# Patient Record
Sex: Female | Born: 1940 | Race: White | Hispanic: No
Health system: Midwestern US, Academic
[De-identification: ages and names within clinical notes are randomized; demographics above are authoritative.]

---

## 2017-02-25 ENCOUNTER — Encounter: Admit: 2017-02-25 | Discharge: 2017-02-25 | Payer: MEDICARE

## 2017-02-25 NOTE — Telephone Encounter
Patient left a voicemail on the nurse line requesting a call back due to questions she has. Attempted reaching patient but no answer. Left a message for her to call back at her convenience.

## 2017-02-26 ENCOUNTER — Encounter: Admit: 2017-02-26 | Discharge: 2017-02-26 | Payer: MEDICARE

## 2017-02-26 NOTE — Telephone Encounter
Spoke with patient. She has been experiencing intermittent chest tightness over the last 3-4 months. The pain occurs at rest and with exertion. It does not occur everyday. It is not worse with inspiration. The pain is mid-sternal and she rates it at its worst, "5/10". It lasts minutes and is relieved with an extra Aspirin 81 mg. She c/o worsening fatigue but denies nausea/SOB/radiation when the chest tightness occurs. She has a bottle of NTG on-hand but has not needed to take any because the pain "isn't that bad". Reviewed her January 2018 NUC and Echo results with patient, per her request.     She saw her GI physician recently and was told her GI work-up was okay. She does have a history of stomach issues.     Scheduled patient to see Norman Clay in clinic this Monday, 11/5 at 4:00 PM. Confirmed date/time/loc of appt with patient. She verbalizes an understanding of how to use NTG and will not hesitate to use it if needed. She will call 911 if pain is not relieved after NTG x 3. Advised patient if she has any further questions or concerns prior to the 11/5 appt, she should not hesitate to call us.

## 2017-02-26 NOTE — Telephone Encounter
Patient returned the calling, leaving a voicemail on the nurse line. Attempted reaching patient but no answer. Left a message for her to call at her convenience.

## 2017-02-28 LAB — CBC: Lab: 13

## 2017-03-03 ENCOUNTER — Ambulatory Visit: Admit: 2017-03-03 | Discharge: 2017-03-04 | Payer: MEDICARE

## 2017-03-03 NOTE — Progress Notes
Date of Service: 03/03/2017    Alison Fuller is a 76 y.o. female.       HPI       I had the pleasure of seeing Alison Fuller for worsening cardiac symptoms.  She is a 76 year old with history of coronary disease and mitral regurgitation status post mitral valve repair in 2013.  She has had PCI to the LAD and left main.  She had a stress test in January that showed no evidence of ischemia and normal LV systolic function.    Over the last 3 or 4 months she has been having chest tightness that occurs randomly and is not necessarily associated with exertion.  She has not taken any nitro.  She states since she made the appointment however she has not had any recurrent discomfort and she considered cancelling.  She had a GI workup recently which was normal.  She also complains of muscle aches and wonders if the statin may be contributing to that.  She denies any shortness of breath, lower extremity edema, palpitations, near-syncope or syncope.           Vitals:    03/03/17 1545   BP: 150/74   Pulse: 68   Weight: 58.2 kg (128 lb 3.2 oz)   Height: 1.549 m (5' 1)     Body mass index is 24.22 kg/m???.     Past Medical History  Patient Active Problem List    Diagnosis Date Noted   ??? Atypical chest pain 02/21/2016   ??? Leg swelling 09/24/2011   ??? DOE (dyspnea on exertion) 07/03/2011   ??? Chest pain 07/03/2011   ??? Mitral valve regurgitation 07/03/2011     07/12/2011 - Robotic mitral valve repair with right thoracoscopy, triangular resection of P2 and implantation of 27-mm posterior annuloplasty band, [employer] serial number [id number], model number [id number].       ??? Lightheadedness      A. 02/28/09 EP consultation for PVC's with reported lightheadedness.  B. 03/06/09 Positive head up tilt table.Tilt-table test is considered positive for syncope with nitroglycerin challenge,       but not in the unchallenged 30-minute portion.     ??? CKD (chronic kidney disease) 04/25/2008   ??? CAD (coronary artery disease) 07/07/2007 a.   03/09 referred by Dr. Richarda Blade for angina.  Exe echo ositive with 3 mm ST depression and ischemia in the anterior and lateral wall.  b.   03/09 angiogram- Left main 70% with ostial LAD 80%.  LCX diminutive vessel.  RCA large dominant prox 20% EF 70%.  Aorta normal.   PCI of LAD with 3.0 Xience stent and PCI of left main with 3.5 Xience stent and IVUS.  d.   06/09 angiogram for followup left main stenting.  Left main and LAD: No disease or restenosis.  LCX and RCA no disease.  EF 55%  e.   12/09 - Cath: widely patent stent in LM-LAD. No other significant stenosis. Normal LV function.  f.    07/10  Ex Echo normal echo.  Negative for ischemia, fair ex tolerance without chest pain  g.  02/05/10 - Stress MPI - 70%EF appears to be a normal study     ??? Hyperlipidemia 07/07/2007     a.   03/09 total cholesterol 222, triglycerides 66, HDL 74, LDL 135. Zocor started.  b.   05/25/08 TC 159 TG 43 HDL 68 LDL 82 - zocor increased to 80mg  daily.   c.   11/23/08  T chol 160, TG 74, HDL 54, LDL 83  C/o gi intolerance ot zocor.  Changed to crestor 20mg  daily and CoQ 10 100mg      ??? HTN (hypertension)          Review of Systems   Constitution: Positive for malaise/fatigue.   HENT: Positive for nosebleeds.    Eyes: Negative.    Cardiovascular: Positive for dyspnea on exertion.   Respiratory: Positive for shortness of breath.    Hematologic/Lymphatic: Negative.    Skin: Negative.    Musculoskeletal: Positive for arthritis, back pain, neck pain and stiffness.   Gastrointestinal: Negative.    Genitourinary: Negative.    Neurological: Positive for vertigo.   Psychiatric/Behavioral: Negative.    Allergic/Immunologic: Negative.        Physical Exam  General Appearance: no acute distress  Skin: warm & intact  HEENT: unremarkable  Neck Veins: neck veins are flat & not distended  Carotid Arteries: no bruits  Chest Inspection: chest is normal in appearance  Auscultation/Percussion: lungs clear to auscultation, no rales, rhonchi, or wheezing  Cardiac Rhythm: regular rhythm & normal rate  Cardiac Auscultation: Normal S1 & S2, no S3 or S4, no rub  Murmurs: no cardiac murmurs   Extremities: no lower extremity edema; 2+ symmetric distal pulses  Abdominal Exam: soft, non-tender, no masses, bowel sounds normal  Liver & Spleen: no organomegaly  Neurologic Exam: oriented to time, place and person; no focal neurologic deficits  Psychiatric: Normal mood and affect.  Behavior is normal. Judgment and thought content normal.         Cardiovascular Studies  Preliminary EKG:    NSR, rate 68 bpm.  Prior anteroseptal infarct      Problems Addressed Today  Encounter Diagnoses   Name Primary?   ??? Coronary artery disease involving native coronary artery of native heart without angina pectoris Yes   ??? Hypertension, unspecified type    ??? Hyperlipidemia, unspecified hyperlipidemia type        Assessment and Plan       1.  Coronary artery disease post PCI in 2009.  She has had atypical chest discomfort which has resolved at this point.  She had a stress test in January and it was stable with no ischemia.  She needs to continue medical management and aggressive risk factor modification.  2.  Dyslipidemia.  She is having some muscle aches and is concerned it may be related to the pravastatin.  She can hold it for 2 weeks and let us know if her symptoms resolve.  3.  Hypertension.  Her blood pressure is a little high today at 150/74 but she states is within normal limits at home.  We will continue to monitor.    Thank you for allowing [place] to participate in the care of this pleasant individual.  If you have any other questions or concerns, please do not hesitate to contact us.           Current Medications (including today's revisions)  ??? aspirin EC 81 mg PO tablet Take 1 Tab by mouth Daily.   ??? CALCIUM PO Take  by mouth. Calcium , magnesium and zinc 1 tablet daily   ??? Cholecalciferol (Vitamin D3) (VITAMIN D-3) 2,000 unit cap Take 1 Cap by mouth daily. ??? FISH OIL 1,200-144-216 mg Cap Take 1 Cap by mouth daily.   ??? hydroCHLOROthiazide (HYDRODIURIL) 25 mg tablet TAKE 1 TABLET BY MOUTH DAILY   ??? lisinopril (PRINIVIL, ZESTRIL) 40 mg tablet Take 1 tablet  by mouth daily.   ??? nitroglycerin (NITROSTAT) 0.4 mg tablet Place 1 tablet under tongue as Needed for Chest Pain.   ??? pravastatin (PRAVACHOL) 40 mg tablet Take 1 tablet by mouth daily.   ??? vitamins, multiple tablet Take 1 Tab by mouth daily.

## 2017-03-04 DIAGNOSIS — E7849 Other hyperlipidemia: ICD-10-CM

## 2017-03-04 DIAGNOSIS — M791 Myalgia, unspecified site: ICD-10-CM

## 2017-03-04 DIAGNOSIS — I10 Essential (primary) hypertension: ICD-10-CM

## 2017-03-04 DIAGNOSIS — I2510 Atherosclerotic heart disease of native coronary artery without angina pectoris: Principal | ICD-10-CM

## 2017-03-06 ENCOUNTER — Encounter: Admit: 2017-03-06 | Discharge: 2017-03-06 | Payer: MEDICARE

## 2017-03-06 DIAGNOSIS — I2510 Atherosclerotic heart disease of native coronary artery without angina pectoris: ICD-10-CM

## 2017-03-06 DIAGNOSIS — I10 Essential (primary) hypertension: ICD-10-CM

## 2017-03-06 DIAGNOSIS — N189 Chronic kidney disease, unspecified: Secondary | ICD-10-CM

## 2017-03-06 DIAGNOSIS — I340 Nonrheumatic mitral (valve) insufficiency: ICD-10-CM

## 2017-03-06 DIAGNOSIS — I493 Ventricular premature depolarization: ICD-10-CM

## 2017-03-06 DIAGNOSIS — R55 Syncope and collapse: ICD-10-CM

## 2017-03-06 DIAGNOSIS — E785 Hyperlipidemia, unspecified: ICD-10-CM

## 2017-03-06 DIAGNOSIS — R002 Palpitations: ICD-10-CM

## 2017-04-09 ENCOUNTER — Encounter: Admit: 2017-04-09 | Discharge: 2017-04-09 | Payer: MEDICARE

## 2017-04-09 MED ORDER — HYDROCHLOROTHIAZIDE 25 MG PO TAB
ORAL_TABLET | Freq: Every day | ORAL | 0 refills | 28.00000 days | Status: AC
Start: 2017-04-09 — End: 2017-06-24

## 2017-04-09 MED ORDER — PRAVASTATIN 40 MG PO TAB
40 mg | ORAL_TABLET | Freq: Every day | ORAL | 0 refills | 90.00000 days | Status: AC
Start: 2017-04-09 — End: 2017-06-19

## 2017-05-13 ENCOUNTER — Encounter: Admit: 2017-05-13 | Discharge: 2017-05-13 | Payer: MEDICARE

## 2017-06-19 ENCOUNTER — Encounter: Admit: 2017-06-19 | Discharge: 2017-06-19 | Payer: MEDICARE

## 2017-06-19 MED ORDER — PRAVASTATIN 40 MG PO TAB
40 mg | ORAL_TABLET | Freq: Every day | ORAL | 3 refills | 90.00000 days | Status: AC
Start: 2017-06-19 — End: 2018-03-18

## 2017-06-19 MED ORDER — LISINOPRIL 40 MG PO TAB
40 mg | ORAL_TABLET | Freq: Every day | ORAL | 3 refills | Status: AC
Start: 2017-06-19 — End: 2018-03-18

## 2017-06-23 ENCOUNTER — Encounter: Admit: 2017-06-23 | Discharge: 2017-06-23 | Payer: MEDICARE

## 2017-06-24 MED ORDER — HYDROCHLOROTHIAZIDE 25 MG PO TAB
ORAL_TABLET | Freq: Every day | ORAL | 2 refills | 28.00000 days | Status: AC
Start: 2017-06-24 — End: 2018-03-16

## 2017-06-27 ENCOUNTER — Encounter: Admit: 2017-06-27 | Discharge: 2017-06-27 | Payer: MEDICARE

## 2017-08-27 ENCOUNTER — Encounter: Admit: 2017-08-27 | Discharge: 2017-08-27 | Payer: MEDICARE

## 2017-08-27 DIAGNOSIS — I340 Nonrheumatic mitral (valve) insufficiency: Principal | ICD-10-CM

## 2017-09-23 ENCOUNTER — Encounter: Admit: 2017-09-23 | Discharge: 2017-09-23 | Payer: MEDICARE

## 2017-09-23 DIAGNOSIS — E785 Hyperlipidemia, unspecified: Principal | ICD-10-CM

## 2017-09-23 DIAGNOSIS — Z79899 Other long term (current) drug therapy: ICD-10-CM

## 2017-09-23 DIAGNOSIS — I10 Essential (primary) hypertension: ICD-10-CM

## 2017-12-16 ENCOUNTER — Ambulatory Visit: Admit: 2017-12-16 | Discharge: 2017-12-16 | Payer: MEDICARE

## 2017-12-16 ENCOUNTER — Encounter: Admit: 2017-12-16 | Discharge: 2017-12-16 | Payer: MEDICARE

## 2017-12-16 DIAGNOSIS — I2510 Atherosclerotic heart disease of native coronary artery without angina pectoris: ICD-10-CM

## 2017-12-16 DIAGNOSIS — I10 Essential (primary) hypertension: ICD-10-CM

## 2017-12-16 DIAGNOSIS — I340 Nonrheumatic mitral (valve) insufficiency: Principal | ICD-10-CM

## 2017-12-16 DIAGNOSIS — N189 Chronic kidney disease, unspecified: Secondary | ICD-10-CM

## 2017-12-16 DIAGNOSIS — R55 Syncope and collapse: ICD-10-CM

## 2017-12-16 DIAGNOSIS — R002 Palpitations: ICD-10-CM

## 2017-12-16 DIAGNOSIS — I493 Ventricular premature depolarization: ICD-10-CM

## 2017-12-16 DIAGNOSIS — E785 Hyperlipidemia, unspecified: ICD-10-CM

## 2017-12-19 ENCOUNTER — Encounter: Admit: 2017-12-19 | Discharge: 2017-12-19 | Payer: MEDICARE

## 2017-12-22 ENCOUNTER — Encounter: Admit: 2017-12-22 | Discharge: 2017-12-22 | Payer: MEDICARE

## 2017-12-22 ENCOUNTER — Ambulatory Visit: Admit: 2017-12-22 | Discharge: 2017-12-22 | Payer: MEDICARE

## 2017-12-22 DIAGNOSIS — I10 Essential (primary) hypertension: ICD-10-CM

## 2017-12-22 DIAGNOSIS — I340 Nonrheumatic mitral (valve) insufficiency: ICD-10-CM

## 2017-12-22 DIAGNOSIS — I2510 Atherosclerotic heart disease of native coronary artery without angina pectoris: ICD-10-CM

## 2017-12-22 DIAGNOSIS — I493 Ventricular premature depolarization: ICD-10-CM

## 2017-12-22 DIAGNOSIS — Z01818 Encounter for other preprocedural examination: ICD-10-CM

## 2017-12-22 DIAGNOSIS — E785 Hyperlipidemia, unspecified: ICD-10-CM

## 2017-12-22 DIAGNOSIS — R002 Palpitations: ICD-10-CM

## 2017-12-22 DIAGNOSIS — R55 Syncope and collapse: ICD-10-CM

## 2017-12-22 DIAGNOSIS — N189 Chronic kidney disease, unspecified: ICD-10-CM

## 2017-12-22 MED ORDER — FENTANYL CITRATE (PF) 50 MCG/ML IJ SOLN
25 ug | INTRAVENOUS | 0 refills | Status: CN | PRN
Start: 2017-12-22 — End: ?

## 2017-12-22 MED ORDER — PROPOFOL 10 MG/ML IV EMUL 20 ML (INFUSION)(AM)(OR)
INTRAVENOUS | 0 refills | Status: DC
Start: 2017-12-22 — End: 2017-12-22
  Administered 2017-12-22: 15:00:00 50 ug/kg/min via INTRAVENOUS

## 2017-12-22 MED ORDER — PHENYLEPHRINE IN 0.9% NACL(PF) 1 MG/10 ML (100 MCG/ML) IV SYRG
0 refills | Status: DC
Start: 2017-12-22 — End: 2017-12-22
  Administered 2017-12-22 (×2): 100 ug via INTRAVENOUS

## 2017-12-22 MED ORDER — ONDANSETRON HCL (PF) 4 MG/2 ML IJ SOLN
4 mg | Freq: Once | INTRAVENOUS | 0 refills | Status: CN | PRN
Start: 2017-12-22 — End: ?

## 2017-12-22 MED ORDER — LIDOCAINE (PF) 200 MG/10 ML (2 %) IJ SYRG
0 refills | Status: DC
Start: 2017-12-22 — End: 2017-12-22
  Administered 2017-12-22: 15:00:00 20 mg via INTRAVENOUS

## 2017-12-22 MED ORDER — SODIUM CHLORIDE 0.9 % IV SOLP (OR) 500ML
0 refills | Status: DC
Start: 2017-12-22 — End: 2017-12-22
  Administered 2017-12-22: 15:00:00 via INTRAVENOUS

## 2017-12-23 ENCOUNTER — Encounter: Admit: 2017-12-23 | Discharge: 2017-12-23 | Payer: MEDICARE

## 2017-12-23 DIAGNOSIS — I340 Nonrheumatic mitral (valve) insufficiency: Principal | ICD-10-CM

## 2017-12-24 ENCOUNTER — Encounter: Admit: 2017-12-24 | Discharge: 2017-12-24 | Payer: MEDICARE

## 2017-12-30 ENCOUNTER — Encounter: Admit: 2017-12-30 | Discharge: 2017-12-30 | Payer: MEDICARE

## 2017-12-31 ENCOUNTER — Encounter: Admit: 2017-12-31 | Discharge: 2017-12-31 | Payer: MEDICARE

## 2017-12-31 DIAGNOSIS — I340 Nonrheumatic mitral (valve) insufficiency: Principal | ICD-10-CM

## 2017-12-31 MED ORDER — LIDOCAINE (PF) 10 MG/ML (1 %) IJ SOLN
.1-2 mL | INTRAMUSCULAR | 0 refills | Status: CN | PRN
Start: 2017-12-31 — End: ?

## 2017-12-31 MED ORDER — ASPIRIN 81 MG PO TBEC
81 mg | Freq: Once | ORAL | 0 refills | Status: CN
Start: 2017-12-31 — End: ?

## 2017-12-31 MED ORDER — SODIUM CHLORIDE 0.9 % IV SOLP
INTRAVENOUS | 0 refills | Status: CN
Start: 2017-12-31 — End: ?

## 2018-01-01 ENCOUNTER — Encounter: Admit: 2018-01-01 | Discharge: 2018-01-01 | Payer: MEDICARE

## 2018-01-05 ENCOUNTER — Ambulatory Visit: Admit: 2018-01-05 | Discharge: 2018-01-05 | Payer: MEDICARE

## 2018-01-05 ENCOUNTER — Encounter: Admit: 2018-01-05 | Discharge: 2018-01-05 | Payer: MEDICARE

## 2018-01-05 ENCOUNTER — Ambulatory Visit: Admit: 2018-01-05 | Discharge: 2018-01-06 | Payer: MEDICARE

## 2018-01-05 DIAGNOSIS — R55 Syncope and collapse: ICD-10-CM

## 2018-01-05 DIAGNOSIS — R002 Palpitations: ICD-10-CM

## 2018-01-05 DIAGNOSIS — I493 Ventricular premature depolarization: ICD-10-CM

## 2018-01-05 DIAGNOSIS — J302 Other seasonal allergic rhinitis: ICD-10-CM

## 2018-01-05 DIAGNOSIS — I340 Nonrheumatic mitral (valve) insufficiency: Principal | ICD-10-CM

## 2018-01-05 DIAGNOSIS — E785 Hyperlipidemia, unspecified: ICD-10-CM

## 2018-01-05 DIAGNOSIS — Z8719 Personal history of other diseases of the digestive system: ICD-10-CM

## 2018-01-05 DIAGNOSIS — M1990 Unspecified osteoarthritis, unspecified site: ICD-10-CM

## 2018-01-05 DIAGNOSIS — J40 Bronchitis, not specified as acute or chronic: ICD-10-CM

## 2018-01-05 DIAGNOSIS — I10 Essential (primary) hypertension: ICD-10-CM

## 2018-01-05 DIAGNOSIS — I2510 Atherosclerotic heart disease of native coronary artery without angina pectoris: Principal | ICD-10-CM

## 2018-01-05 DIAGNOSIS — N189 Chronic kidney disease, unspecified: Secondary | ICD-10-CM

## 2018-01-05 LAB — COMPREHENSIVE METABOLIC PANEL
Lab: 0.6 mg/dL (ref 0.3–1.2)
Lab: 10 mg/dL (ref 8.5–10.6)
Lab: 101 MMOL/L (ref 98–110)
Lab: 13 U/L (ref 7–56)
Lab: 138 MMOL/L (ref 137–147)
Lab: 19 U/L (ref 7–40)
Lab: 27 mg/dL — ABNORMAL HIGH (ref 7–25)
Lab: 38 mL/min — ABNORMAL LOW (ref >60)
Lab: 4.2 MMOL/L (ref 3.5–5.1)
Lab: 4.5 g/dL (ref 3.5–5.0)
Lab: 46 mL/min — ABNORMAL LOW (ref >60)
Lab: 69 mg/dL — ABNORMAL LOW (ref 70–100)
Lab: 7 (ref 3–12)
Lab: 7.7 g/dL (ref 6.0–8.0)

## 2018-01-05 LAB — PTT (APTT): Lab: 29 s (ref 24.0–36.5)

## 2018-01-05 LAB — URINALYSIS DIPSTICK REFLEX TO CULTURE: Lab: NEGATIVE

## 2018-01-05 LAB — PROTIME INR (PT): Lab: 0.9 U/L (ref 0.8–1.2)

## 2018-01-05 LAB — CBC
Lab: 4.2 M/UL (ref 4.0–5.0)
Lab: 7.5 10*3/uL (ref 4.5–11.0)

## 2018-01-05 LAB — URINALYSIS MICROSCOPIC REFLEX TO CULTURE

## 2018-01-05 LAB — BNP (B-TYPE NATRIURETIC PEPTI): Lab: 53 pg/mL (ref 0–100)

## 2018-01-06 ENCOUNTER — Inpatient Hospital Stay: Admit: 2018-01-06 | Discharge: 2018-01-06 | Payer: MEDICARE

## 2018-01-06 ENCOUNTER — Encounter: Admit: 2018-01-06 | Discharge: 2018-01-06 | Payer: MEDICARE

## 2018-01-06 ENCOUNTER — Inpatient Hospital Stay: Admit: 2018-01-06 | Discharge: 2018-01-07 | Disposition: A | Discharge status: Home / Self Care | Payer: MEDICARE

## 2018-01-06 DIAGNOSIS — R55 Syncope and collapse: ICD-10-CM

## 2018-01-06 DIAGNOSIS — I2510 Atherosclerotic heart disease of native coronary artery without angina pectoris: Principal | ICD-10-CM

## 2018-01-06 DIAGNOSIS — I340 Nonrheumatic mitral (valve) insufficiency: ICD-10-CM

## 2018-01-06 DIAGNOSIS — I493 Ventricular premature depolarization: ICD-10-CM

## 2018-01-06 DIAGNOSIS — N189 Chronic kidney disease, unspecified: Secondary | ICD-10-CM

## 2018-01-06 DIAGNOSIS — M1990 Unspecified osteoarthritis, unspecified site: ICD-10-CM

## 2018-01-06 DIAGNOSIS — R002 Palpitations: ICD-10-CM

## 2018-01-06 DIAGNOSIS — E785 Hyperlipidemia, unspecified: ICD-10-CM

## 2018-01-06 DIAGNOSIS — I10 Essential (primary) hypertension: ICD-10-CM

## 2018-01-06 DIAGNOSIS — J40 Bronchitis, not specified as acute or chronic: ICD-10-CM

## 2018-01-06 DIAGNOSIS — Z8719 Personal history of other diseases of the digestive system: ICD-10-CM

## 2018-01-06 DIAGNOSIS — J302 Other seasonal allergic rhinitis: ICD-10-CM

## 2018-01-06 LAB — BASIC METABOLIC PANEL
Lab: 1.1 mg/dL — ABNORMAL HIGH (ref 0.4–1.00)
Lab: 106 mg/dL — ABNORMAL HIGH (ref 70–100)
Lab: 139 MMOL/L — ABNORMAL LOW (ref 137–147)
Lab: 22 mg/dL (ref 7–25)
Lab: 26 MMOL/L (ref 21–30)
Lab: 44 mL/min — ABNORMAL LOW (ref >60)
Lab: 54 mL/min — ABNORMAL LOW (ref >60)
Lab: 7 pg (ref 3–12)
Lab: 8.5 mg/dL (ref 8.5–10.6)

## 2018-01-06 LAB — PROTIME INR (PT): Lab: 0.9 MMOL/L (ref 0.8–1.2)

## 2018-01-06 LAB — PTT (APTT): Lab: 41 s — ABNORMAL HIGH (ref 24.0–36.5)

## 2018-01-06 LAB — CBC: Lab: 9.4 10*3/uL (ref 4.5–11.0)

## 2018-01-06 MED ORDER — ACETAMINOPHEN 325 MG PO TAB
650 mg | ORAL_TABLET | ORAL | 0 refills | Status: CN | PRN
Start: 2018-01-06 — End: ?

## 2018-01-06 MED ORDER — PROPOFOL INJ 10 MG/ML IV VIAL
0 refills | Status: DC
Start: 2018-01-06 — End: 2018-01-06
  Administered 2018-01-06: 14:00:00 50 mg via INTRAVENOUS
  Administered 2018-01-06: 15:00:00 20 mg via INTRAVENOUS
  Administered 2018-01-06: 13:00:00 100 mg via INTRAVENOUS
  Administered 2018-01-06: 14:00:00 30 mg via INTRAVENOUS

## 2018-01-06 MED ORDER — CEFAZOLIN INJ 1GM IVP
1 g | Freq: Two times a day (BID) | INTRAVENOUS | 0 refills | Status: CP
Start: 2018-01-06 — End: ?
  Administered 2018-01-07 (×2): 1 g via INTRAVENOUS

## 2018-01-06 MED ORDER — OXYCODONE 5 MG PO TAB
5-10 mg | ORAL | 0 refills | Status: DC | PRN
Start: 2018-01-06 — End: 2018-01-07

## 2018-01-06 MED ORDER — PHENYLEPHRINE IV DRIP (STD CONC)
0 refills | Status: DC
Start: 2018-01-06 — End: 2018-01-06
  Administered 2018-01-06 (×2): 0.2 ug/kg/min via INTRAVENOUS

## 2018-01-06 MED ORDER — BISACODYL 10 MG RE SUPP
10 mg | Freq: Every day | RECTAL | 0 refills | Status: DC | PRN
Start: 2018-01-06 — End: 2018-01-07

## 2018-01-06 MED ORDER — LIDOCAINE (PF) 10 MG/ML (1 %) IJ SOLN
.1-2 mL | INTRAMUSCULAR | 0 refills | Status: DC | PRN
Start: 2018-01-06 — End: 2018-01-06

## 2018-01-06 MED ORDER — HYDRALAZINE 20 MG/ML IJ SOLN
10 mg | INTRAVENOUS | 0 refills | Status: DC | PRN
Start: 2018-01-06 — End: 2018-01-07

## 2018-01-06 MED ORDER — METOCLOPRAMIDE HCL 5 MG/ML IJ SOLN
10 mg | Freq: Once | INTRAVENOUS | 0 refills | Status: DC | PRN
Start: 2018-01-06 — End: 2018-01-06

## 2018-01-06 MED ORDER — MULTIVITAMIN PO TAB
1 | Freq: Every day | ORAL | 0 refills | Status: DC
Start: 2018-01-06 — End: 2018-01-07
  Administered 2018-01-06 – 2018-01-07 (×2): 1 via ORAL

## 2018-01-06 MED ORDER — PROCHLORPERAZINE 25 MG RE SUPP
25 mg | RECTAL | 0 refills | Status: DC | PRN
Start: 2018-01-06 — End: 2018-01-07

## 2018-01-06 MED ORDER — LIDOCAINE (PF) 200 MG/10 ML (2 %) IJ SYRG
0 refills | Status: DC
Start: 2018-01-06 — End: 2018-01-06
  Administered 2018-01-06: 13:00:00 80 mg via INTRAVENOUS

## 2018-01-06 MED ORDER — VECURONIUM BROMIDE 10 MG IV SOLR
0 refills | Status: DC
Start: 2018-01-06 — End: 2018-01-06
  Administered 2018-01-06: 13:00:00 4 mg via INTRAVENOUS

## 2018-01-06 MED ORDER — NEOSTIGMINE METHYLSULFATE 1 MG/ML IJ SOLN
0 refills | Status: DC
Start: 2018-01-06 — End: 2018-01-06
  Administered 2018-01-06: 15:00:00 3 mg via INTRAVENOUS

## 2018-01-06 MED ORDER — ACETAMINOPHEN 325 MG PO TAB
650 mg | ORAL | 0 refills | Status: DC | PRN
Start: 2018-01-06 — End: 2018-01-07

## 2018-01-06 MED ORDER — OMEGA 3-DHA-EPA-FISH OIL 300-1,000 MG PO CPDR
1000 mg | Freq: Every day | ORAL | 0 refills | Status: DC
Start: 2018-01-06 — End: 2018-01-07
  Administered 2018-01-06 – 2018-01-07 (×2): 1000 mg via ORAL

## 2018-01-06 MED ORDER — ASPIRIN 81 MG PO TBEC
81 mg | Freq: Once | ORAL | 0 refills | Status: CP
Start: 2018-01-06 — End: ?
  Administered 2018-01-06: 13:00:00 81 mg via ORAL

## 2018-01-06 MED ORDER — SODIUM CHLORIDE 0.9 % IV SOLP
INTRAVENOUS | 0 refills | Status: DC
Start: 2018-01-06 — End: 2018-01-07

## 2018-01-06 MED ORDER — GLYCOPYRROLATE 0.2 MG/ML IJ SOLN
0 refills | Status: DC
Start: 2018-01-06 — End: 2018-01-06
  Administered 2018-01-06: 15:00:00 .4 mg via INTRAVENOUS

## 2018-01-06 MED ORDER — PANTOPRAZOLE 40 MG PO TBEC
40 mg | Freq: Every day | ORAL | 0 refills | Status: DC | PRN
Start: 2018-01-06 — End: 2018-01-07

## 2018-01-06 MED ORDER — ONDANSETRON HCL (PF) 4 MG/2 ML IJ SOLN
4 mg | INTRAVENOUS | 0 refills | Status: DC | PRN
Start: 2018-01-06 — End: 2018-01-07

## 2018-01-06 MED ORDER — FENTANYL CITRATE (PF) 50 MCG/ML IJ SOLN
25 ug | INTRAVENOUS | 0 refills | Status: DC | PRN
Start: 2018-01-06 — End: 2018-01-06

## 2018-01-06 MED ORDER — POTASSIUM CHLORIDE 20 MEQ PO TBTQ
20-40 meq | ORAL | 0 refills | Status: DC | PRN
Start: 2018-01-06 — End: 2018-01-07
  Administered 2018-01-07: 11:00:00 20 meq via ORAL

## 2018-01-06 MED ORDER — SODIUM CHLORIDE 0.9 % IV SOLP
INTRAVENOUS | 0 refills | Status: DC
Start: 2018-01-06 — End: 2018-01-06
  Administered 2018-01-06: 12:00:00 1000.000 mL via INTRAVENOUS

## 2018-01-06 MED ORDER — FUROSEMIDE 10 MG/ML IJ SOLN
40 mg | Freq: Once | INTRAVENOUS | 0 refills | Status: CP
Start: 2018-01-06 — End: ?
  Administered 2018-01-06: 21:00:00 40 mg via INTRAVENOUS

## 2018-01-06 MED ORDER — CLOPIDOGREL 75 MG PO TAB
75 mg | ORAL_TABLET | Freq: Every day | ORAL | 3 refills | Status: CN
Start: 2018-01-06 — End: ?

## 2018-01-06 MED ORDER — POTASSIUM CHLORIDE 20 MEQ/15 ML PO LIQD
20-40 meq | NASOGASTRIC | 0 refills | Status: DC | PRN
Start: 2018-01-06 — End: 2018-01-07

## 2018-01-06 MED ORDER — HALOPERIDOL LACTATE 5 MG/ML IJ SOLN
0 refills | Status: DC
Start: 2018-01-06 — End: 2018-01-06
  Administered 2018-01-06: 15:00:00 1 mg via INTRAVENOUS

## 2018-01-06 MED ORDER — PROTAMINE 10 MG/ML IV SOLN
0 refills | Status: DC
Start: 2018-01-06 — End: 2018-01-06
  Administered 2018-01-06: 15:00:00 20 mg via INTRAVENOUS
  Administered 2018-01-06: 15:00:00 10 mg via INTRAVENOUS
  Administered 2018-01-06: 15:00:00 20 mg via INTRAVENOUS

## 2018-01-06 MED ORDER — SENNOSIDES-DOCUSATE SODIUM 8.6-50 MG PO TAB
2 | Freq: Two times a day (BID) | ORAL | 0 refills | Status: DC
Start: 2018-01-06 — End: 2018-01-07
  Administered 2018-01-07: 01:00:00 2 via ORAL

## 2018-01-06 MED ORDER — POTASSIUM CHLORIDE 20 MEQ PO TBTQ
20 meq | Freq: Once | ORAL | 0 refills | Status: CP
Start: 2018-01-06 — End: ?
  Administered 2018-01-06: 20:00:00 20 meq via ORAL

## 2018-01-06 MED ORDER — CEFAZOLIN 1 GRAM IJ SOLR
0 refills | Status: DC
Start: 2018-01-06 — End: 2018-01-06
  Administered 2018-01-06: 14:00:00 2 g via INTRAVENOUS

## 2018-01-06 MED ORDER — BENZOCAINE-MENTHOL 6-10 MG MM LOZG
1 | ORAL | 0 refills | Status: DC | PRN
Start: 2018-01-06 — End: 2018-01-07
  Administered 2018-01-06: 19:00:00 1 via ORAL

## 2018-01-06 MED ORDER — CLOPIDOGREL 75 MG PO TAB
75 mg | Freq: Every day | ORAL | 0 refills | Status: DC
Start: 2018-01-06 — End: 2018-01-07
  Administered 2018-01-06 – 2018-01-07 (×2): 75 mg via ORAL

## 2018-01-06 MED ORDER — MAGNESIUM HYDROXIDE 2,400 MG/10 ML PO SUSP
10 mL | Freq: Every day | ORAL | 0 refills | Status: DC | PRN
Start: 2018-01-06 — End: 2018-01-07

## 2018-01-06 MED ORDER — INSULIN ASPART 100 UNIT/ML SC FLEXPEN
0-12 [IU] | Freq: Before meals | SUBCUTANEOUS | 0 refills | Status: DC
Start: 2018-01-06 — End: 2018-01-07

## 2018-01-06 MED ORDER — HYDROCHLOROTHIAZIDE 25 MG PO TAB
25 mg | Freq: Every day | ORAL | 0 refills | Status: DC
Start: 2018-01-06 — End: 2018-01-07
  Administered 2018-01-07: 13:00:00 25 mg via ORAL

## 2018-01-06 MED ORDER — PROMETHAZINE 25 MG/ML IJ SOLN
6.25 mg | INTRAVENOUS | 0 refills | Status: DC | PRN
Start: 2018-01-06 — End: 2018-01-06

## 2018-01-06 MED ORDER — LORATADINE 10 MG PO TAB
10 mg | Freq: Every morning | ORAL | 0 refills | Status: DC
Start: 2018-01-06 — End: 2018-01-07
  Administered 2018-01-07: 13:00:00 10 mg via ORAL

## 2018-01-06 MED ORDER — HEPARIN (PORCINE) 1,000 UNIT/ML IJ SOLN
0 refills | Status: DC
Start: 2018-01-06 — End: 2018-01-06
  Administered 2018-01-06: 14:00:00 2000 [IU] via INTRAVENOUS
  Administered 2018-01-06: 15:00:00 1000 [IU] via INTRAVENOUS
  Administered 2018-01-06: 14:00:00 4000 [IU] via INTRAVENOUS
  Administered 2018-01-06: 15:00:00 2000 [IU] via INTRAVENOUS

## 2018-01-07 ENCOUNTER — Encounter: Admit: 2018-01-07 | Discharge: 2018-01-07 | Payer: MEDICARE

## 2018-01-07 ENCOUNTER — Inpatient Hospital Stay: Admit: 2018-01-05 | Discharge: 2018-01-05 | Payer: MEDICARE

## 2018-01-07 ENCOUNTER — Inpatient Hospital Stay: Admit: 2018-01-07 | Discharge: 2018-01-07 | Payer: MEDICARE

## 2018-01-07 DIAGNOSIS — Z9861 Coronary angioplasty status: ICD-10-CM

## 2018-01-07 DIAGNOSIS — I5032 Chronic diastolic (congestive) heart failure: ICD-10-CM

## 2018-01-07 DIAGNOSIS — Z8711 Personal history of peptic ulcer disease: ICD-10-CM

## 2018-01-07 DIAGNOSIS — E785 Hyperlipidemia, unspecified: ICD-10-CM

## 2018-01-07 DIAGNOSIS — Z006 Encounter for examination for normal comparison and control in clinical research program: ICD-10-CM

## 2018-01-07 DIAGNOSIS — I2510 Atherosclerotic heart disease of native coronary artery without angina pectoris: ICD-10-CM

## 2018-01-07 DIAGNOSIS — R0609 Other forms of dyspnea: ICD-10-CM

## 2018-01-07 DIAGNOSIS — N189 Chronic kidney disease, unspecified: ICD-10-CM

## 2018-01-07 DIAGNOSIS — I340 Nonrheumatic mitral (valve) insufficiency: Principal | ICD-10-CM

## 2018-01-07 DIAGNOSIS — I130 Hypertensive heart and chronic kidney disease with heart failure and stage 1 through stage 4 chronic kidney disease, or unspecified chronic kidney disease: ICD-10-CM

## 2018-01-07 LAB — POC ACTIVATED CLOTTING TIME
Lab: 268 s
Lab: 295 s
Lab: 373 s

## 2018-01-07 LAB — POC GLUCOSE: Lab: 109 mg/dL — ABNORMAL HIGH (ref 70–100)

## 2018-01-07 LAB — HEMOGLOBIN A1C: Lab: 5.2 % — ABNORMAL HIGH (ref 4.0–6.0)

## 2018-01-07 LAB — CBC: Lab: 9 K/UL — ABNORMAL HIGH (ref >60)

## 2018-01-07 LAB — BASIC METABOLIC PANEL: Lab: 142 MMOL/L — ABNORMAL HIGH (ref >60)

## 2018-01-07 MED ORDER — PERFLUTREN LIPID MICROSPHERES 1.1 MG/ML IV SUSP
1-20 mL | Freq: Once | INTRAVENOUS | 0 refills | Status: CP | PRN
Start: 2018-01-07 — End: ?
  Administered 2018-01-07: 15:00:00 2 mL via INTRAVENOUS

## 2018-01-07 MED ORDER — ACETAMINOPHEN 325 MG PO TAB
650 mg | ORAL | 0 refills | Status: AC | PRN
Start: 2018-01-07 — End: ?

## 2018-01-07 MED ORDER — CLOPIDOGREL 75 MG PO TAB
75 mg | ORAL_TABLET | Freq: Every day | ORAL | 0 refills | 90.00000 days | Status: AC
Start: 2018-01-07 — End: 2018-01-27
  Filled 2018-01-07 (×2): qty 30, 30d supply, fill #1

## 2018-01-12 ENCOUNTER — Encounter: Admit: 2018-01-12 | Discharge: 2018-01-12 | Payer: MEDICARE

## 2018-01-27 ENCOUNTER — Encounter: Admit: 2018-01-27 | Discharge: 2018-01-27 | Payer: MEDICARE

## 2018-01-27 MED ORDER — CLOPIDOGREL 75 MG PO TAB
75 mg | ORAL_TABLET | Freq: Every day | ORAL | 0 refills | 90.00000 days | Status: AC
Start: 2018-01-27 — End: 2018-07-27

## 2018-01-28 ENCOUNTER — Encounter: Admit: 2018-01-28 | Discharge: 2018-01-28 | Payer: MEDICARE

## 2018-01-28 MED ORDER — CLOPIDOGREL 75 MG PO TAB
ORAL_TABLET | Freq: Every day | 0 refills
Start: 2018-01-28 — End: ?

## 2018-02-03 ENCOUNTER — Encounter: Admit: 2018-02-03 | Discharge: 2018-02-03 | Payer: MEDICARE

## 2018-02-03 DIAGNOSIS — Z9889 Other specified postprocedural states: ICD-10-CM

## 2018-02-03 DIAGNOSIS — I10 Essential (primary) hypertension: Principal | ICD-10-CM

## 2018-02-03 DIAGNOSIS — I340 Nonrheumatic mitral (valve) insufficiency: ICD-10-CM

## 2018-02-06 ENCOUNTER — Ambulatory Visit: Admit: 2018-02-06 | Discharge: 2018-02-06 | Payer: MEDICARE

## 2018-02-06 ENCOUNTER — Encounter: Admit: 2018-02-06 | Discharge: 2018-02-06 | Payer: MEDICARE

## 2018-02-06 DIAGNOSIS — Z8719 Personal history of other diseases of the digestive system: ICD-10-CM

## 2018-02-06 DIAGNOSIS — I493 Ventricular premature depolarization: ICD-10-CM

## 2018-02-06 DIAGNOSIS — I2510 Atherosclerotic heart disease of native coronary artery without angina pectoris: ICD-10-CM

## 2018-02-06 DIAGNOSIS — I10 Essential (primary) hypertension: ICD-10-CM

## 2018-02-06 DIAGNOSIS — I5032 Chronic diastolic (congestive) heart failure: ICD-10-CM

## 2018-02-06 DIAGNOSIS — N189 Chronic kidney disease, unspecified: Secondary | ICD-10-CM

## 2018-02-06 DIAGNOSIS — I340 Nonrheumatic mitral (valve) insufficiency: ICD-10-CM

## 2018-02-06 DIAGNOSIS — Z95818 Presence of other cardiac implants and grafts: ICD-10-CM

## 2018-02-06 DIAGNOSIS — J302 Other seasonal allergic rhinitis: ICD-10-CM

## 2018-02-06 DIAGNOSIS — E785 Hyperlipidemia, unspecified: ICD-10-CM

## 2018-02-06 DIAGNOSIS — Z9889 Other specified postprocedural states: Secondary | ICD-10-CM

## 2018-02-06 DIAGNOSIS — R002 Palpitations: ICD-10-CM

## 2018-02-06 DIAGNOSIS — M1990 Unspecified osteoarthritis, unspecified site: ICD-10-CM

## 2018-02-06 DIAGNOSIS — J40 Bronchitis, not specified as acute or chronic: ICD-10-CM

## 2018-02-06 DIAGNOSIS — R55 Syncope and collapse: ICD-10-CM

## 2018-02-06 LAB — CBC
Lab: 4.3 M/UL — ABNORMAL HIGH (ref 8–22)
Lab: 41 % — ABNORMAL HIGH (ref 70–100)
Lab: 6.5 10*3/uL (ref 4.5–11.0)

## 2018-02-06 LAB — BNP (B-TYPE NATRIURETIC PEPTI): Lab: 104 pg/mL — ABNORMAL HIGH (ref 0.72–1.25)

## 2018-02-06 LAB — BASIC METABOLIC PANEL
Lab: 1.3 mg/dL — ABNORMAL HIGH (ref 0.4–1.00)
Lab: 103 MMOL/L (ref 98–110)
Lab: 139 MMOL/L (ref 137–147)
Lab: 28 MMOL/L (ref 21–30)
Lab: 3.8 MMOL/L (ref 3.5–5.1)
Lab: 32 mg/dL — ABNORMAL HIGH (ref 7–25)
Lab: 37 mL/min — ABNORMAL LOW (ref >60)
Lab: 45 mL/min — ABNORMAL LOW (ref >60)
Lab: 8 10*3/uL (ref 3–12)
Lab: 87 mg/dL (ref 70–100)
Lab: 9.9 mg/dL (ref 8.5–10.6)

## 2018-02-10 ENCOUNTER — Encounter: Admit: 2018-02-10 | Discharge: 2018-02-10 | Payer: MEDICARE

## 2018-02-10 DIAGNOSIS — Z8719 Personal history of other diseases of the digestive system: ICD-10-CM

## 2018-02-10 DIAGNOSIS — I10 Essential (primary) hypertension: ICD-10-CM

## 2018-02-10 DIAGNOSIS — R002 Palpitations: ICD-10-CM

## 2018-02-10 DIAGNOSIS — E785 Hyperlipidemia, unspecified: ICD-10-CM

## 2018-02-10 DIAGNOSIS — N189 Chronic kidney disease, unspecified: ICD-10-CM

## 2018-02-10 DIAGNOSIS — J40 Bronchitis, not specified as acute or chronic: ICD-10-CM

## 2018-02-10 DIAGNOSIS — M1990 Unspecified osteoarthritis, unspecified site: ICD-10-CM

## 2018-02-10 DIAGNOSIS — J302 Other seasonal allergic rhinitis: ICD-10-CM

## 2018-02-10 DIAGNOSIS — R55 Syncope and collapse: ICD-10-CM

## 2018-02-10 DIAGNOSIS — I340 Nonrheumatic mitral (valve) insufficiency: ICD-10-CM

## 2018-02-10 DIAGNOSIS — I493 Ventricular premature depolarization: ICD-10-CM

## 2018-02-10 DIAGNOSIS — I2510 Atherosclerotic heart disease of native coronary artery without angina pectoris: Principal | ICD-10-CM

## 2018-03-16 ENCOUNTER — Encounter: Admit: 2018-03-16 | Discharge: 2018-03-16 | Payer: MEDICARE

## 2018-03-16 MED ORDER — HYDROCHLOROTHIAZIDE 25 MG PO TAB
ORAL_TABLET | Freq: Every day | ORAL | 1 refills | 28.00000 days | Status: AC
Start: 2018-03-16 — End: 2018-06-22

## 2018-03-17 ENCOUNTER — Encounter: Admit: 2018-03-17 | Discharge: 2018-03-17 | Payer: MEDICARE

## 2018-03-17 DIAGNOSIS — E785 Hyperlipidemia, unspecified: ICD-10-CM

## 2018-03-17 DIAGNOSIS — I10 Essential (primary) hypertension: Principal | ICD-10-CM

## 2018-03-17 DIAGNOSIS — I2510 Atherosclerotic heart disease of native coronary artery without angina pectoris: ICD-10-CM

## 2018-03-18 MED ORDER — LISINOPRIL 40 MG PO TAB
40 mg | ORAL_TABLET | Freq: Every day | ORAL | 0 refills | Status: AC
Start: 2018-03-18 — End: 2018-06-22

## 2018-03-18 MED ORDER — PRAVASTATIN 40 MG PO TAB
40 mg | ORAL_TABLET | Freq: Every evening | ORAL | 0 refills | 90.00000 days | Status: AC
Start: 2018-03-18 — End: 2018-07-21

## 2018-05-26 ENCOUNTER — Encounter: Admit: 2018-05-26 | Discharge: 2018-05-26 | Payer: MEDICARE

## 2018-05-26 DIAGNOSIS — I10 Essential (primary) hypertension: Secondary | ICD-10-CM

## 2018-05-26 DIAGNOSIS — I2510 Atherosclerotic heart disease of native coronary artery without angina pectoris: Secondary | ICD-10-CM

## 2018-05-26 DIAGNOSIS — E785 Hyperlipidemia, unspecified: Secondary | ICD-10-CM

## 2018-05-26 LAB — ALT (SGPT): Lab: 29 U/L (ref 7–56)

## 2018-06-22 ENCOUNTER — Encounter: Admit: 2018-06-22 | Discharge: 2018-06-22 | Payer: MEDICARE

## 2018-06-22 MED ORDER — HYDROCHLOROTHIAZIDE 25 MG PO TAB
25 mg | ORAL_TABLET | Freq: Every day | ORAL | 1 refills | 28.00000 days | Status: AC
Start: 2018-06-22 — End: 2019-02-01

## 2018-06-22 MED ORDER — LISINOPRIL 40 MG PO TAB
40 mg | ORAL_TABLET | Freq: Every day | ORAL | 0 refills | Status: AC
Start: 2018-06-22 — End: 2018-09-29

## 2018-06-22 MED ORDER — PANTOPRAZOLE 40 MG PO TBEC
40 mg | ORAL_TABLET | Freq: Every day | ORAL | 1 refills | 90.00000 days | Status: AC | PRN
Start: 2018-06-22 — End: 2019-02-01

## 2018-06-30 ENCOUNTER — Encounter: Admit: 2018-06-30 | Discharge: 2018-06-30 | Payer: MEDICARE

## 2018-07-21 ENCOUNTER — Encounter: Admit: 2018-07-21 | Discharge: 2018-07-21 | Payer: MEDICARE

## 2018-07-21 MED ORDER — PRAVASTATIN 40 MG PO TAB
40 mg | ORAL_TABLET | Freq: Every evening | ORAL | 0 refills | 90.00000 days | Status: AC
Start: 2018-07-21 — End: 2018-11-12

## 2018-07-27 ENCOUNTER — Encounter: Admit: 2018-07-27 | Discharge: 2018-07-27 | Payer: MEDICARE

## 2018-07-27 DIAGNOSIS — I340 Nonrheumatic mitral (valve) insufficiency: Principal | ICD-10-CM

## 2018-07-27 DIAGNOSIS — M1990 Unspecified osteoarthritis, unspecified site: ICD-10-CM

## 2018-07-27 DIAGNOSIS — J302 Other seasonal allergic rhinitis: ICD-10-CM

## 2018-07-27 DIAGNOSIS — R55 Syncope and collapse: ICD-10-CM

## 2018-07-27 DIAGNOSIS — I493 Ventricular premature depolarization: ICD-10-CM

## 2018-07-27 DIAGNOSIS — E785 Hyperlipidemia, unspecified: ICD-10-CM

## 2018-07-27 DIAGNOSIS — I10 Essential (primary) hypertension: ICD-10-CM

## 2018-07-27 DIAGNOSIS — J40 Bronchitis, not specified as acute or chronic: ICD-10-CM

## 2018-07-27 DIAGNOSIS — N189 Chronic kidney disease, unspecified: ICD-10-CM

## 2018-07-27 DIAGNOSIS — R002 Palpitations: ICD-10-CM

## 2018-07-27 DIAGNOSIS — I2510 Atherosclerotic heart disease of native coronary artery without angina pectoris: Principal | ICD-10-CM

## 2018-07-27 DIAGNOSIS — R0600 Dyspnea, unspecified: ICD-10-CM

## 2018-07-27 DIAGNOSIS — Z8719 Personal history of other diseases of the digestive system: ICD-10-CM

## 2018-07-27 MED ORDER — NITROGLYCERIN 0.4 MG SL SUBL
0.4 mg | ORAL_TABLET | SUBLINGUAL | 2 refills | 9.00000 days | Status: AC | PRN
Start: 2018-07-27 — End: ?

## 2018-07-28 ENCOUNTER — Ambulatory Visit: Admit: 2018-07-27 | Discharge: 2018-07-28 | Payer: MEDICARE

## 2018-07-28 DIAGNOSIS — I340 Nonrheumatic mitral (valve) insufficiency: Principal | ICD-10-CM

## 2018-07-28 DIAGNOSIS — I493 Ventricular premature depolarization: ICD-10-CM

## 2018-07-28 DIAGNOSIS — Z95818 Presence of other cardiac implants and grafts: ICD-10-CM

## 2018-07-28 DIAGNOSIS — I5032 Chronic diastolic (congestive) heart failure: ICD-10-CM

## 2018-07-28 DIAGNOSIS — E876 Hypokalemia: ICD-10-CM

## 2018-07-28 DIAGNOSIS — R0602 Shortness of breath: ICD-10-CM

## 2018-07-28 DIAGNOSIS — E782 Mixed hyperlipidemia: ICD-10-CM

## 2018-07-28 DIAGNOSIS — I10 Essential (primary) hypertension: ICD-10-CM

## 2018-07-28 DIAGNOSIS — R0609 Other forms of dyspnea: ICD-10-CM

## 2018-07-28 DIAGNOSIS — Z9889 Other specified postprocedural states: ICD-10-CM

## 2018-07-28 DIAGNOSIS — I2510 Atherosclerotic heart disease of native coronary artery without angina pectoris: ICD-10-CM

## 2018-09-17 IMAGING — CR CHEST
1 series · 1 of 1 positions shown · non-contrast
Comparison: none

[chest port x-wise]
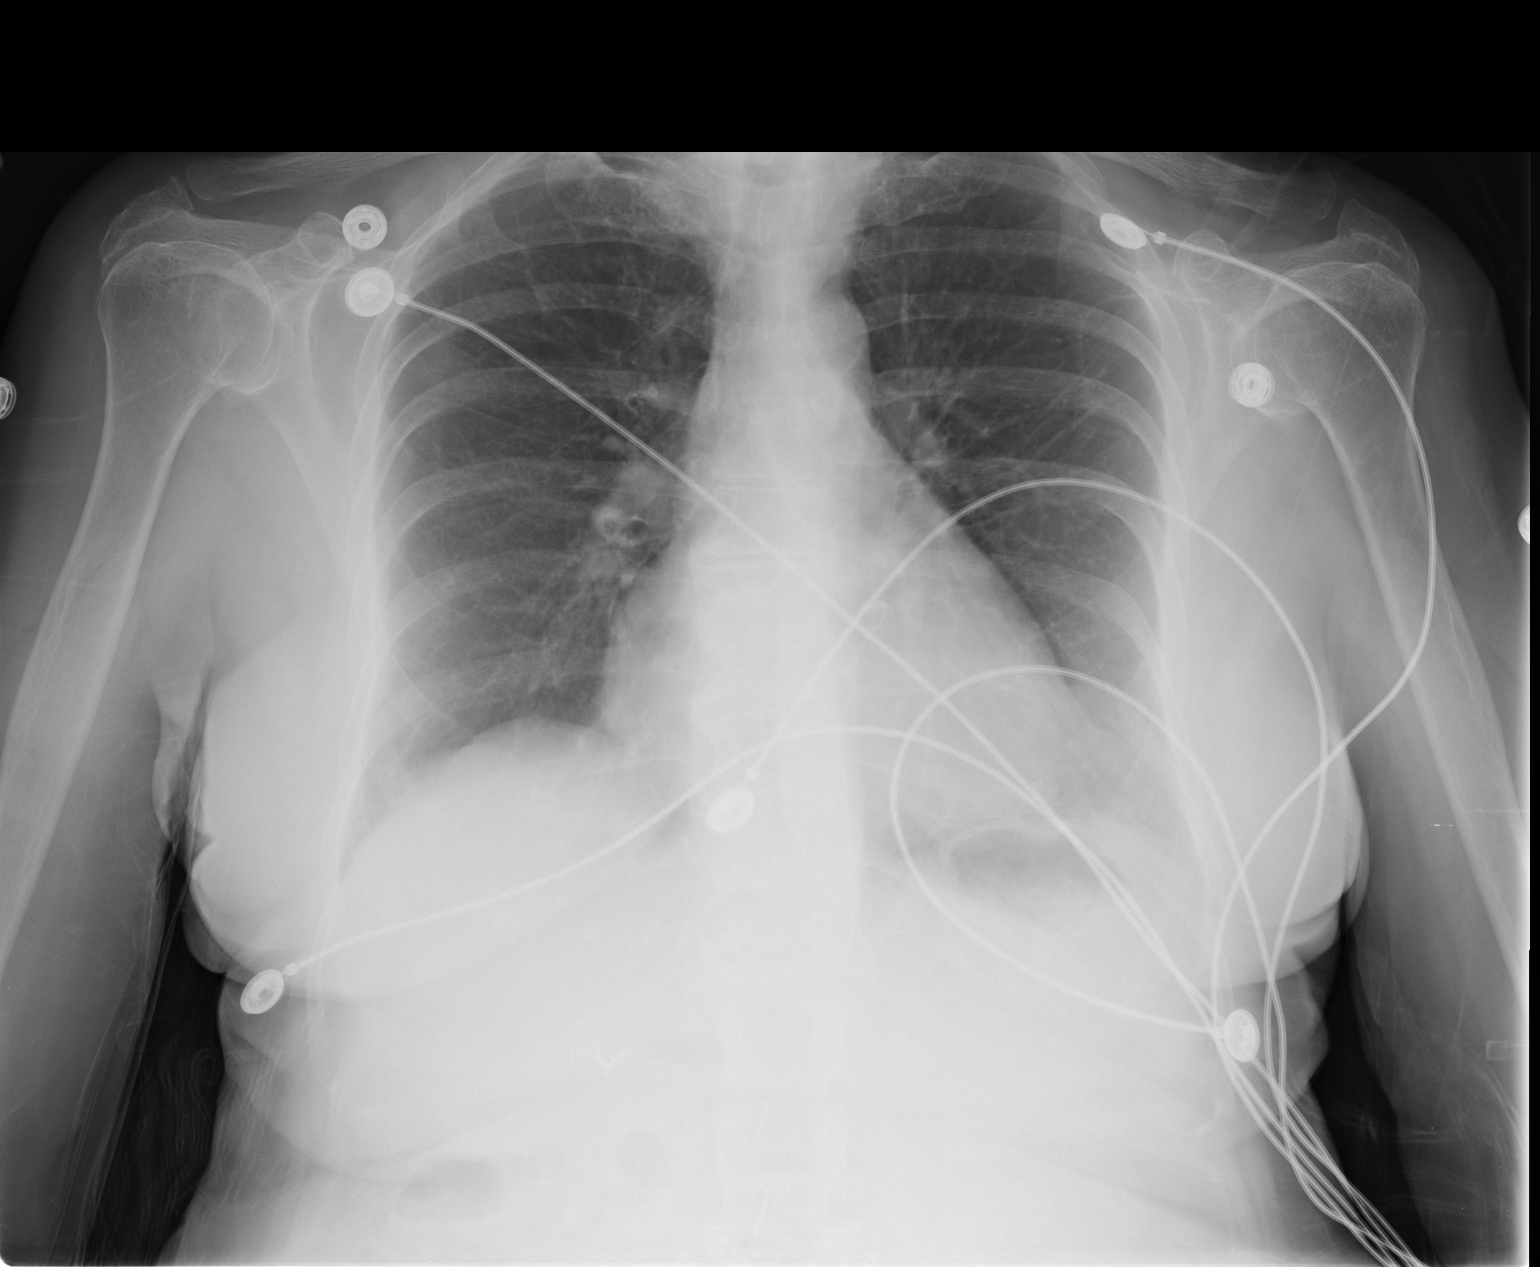

[1 of 1 positions shown; findings below may reference images not displayed]

EXAM
RADIOLOGICAL EXAMINATION, CHEST; SINGLE VIEW, FRONTAL CPT 27575

INDICATION
Louissaint Yvenie Jacinthe due to fall. Syncope, Hx of heart valve replacement. AW

TECHNIQUE
1 view of the chest was acquired.

COMPARISONS
[Previous examination dated 04/07/2017.

FINDINGS
The cardiac silhouette is within normal limits. Lungs are free of focal consolidations. Chronic
interstitial changes are noted. The aortic arch calcified. . Pulmonary vasculature is normal in
caliber.

IMPRESSION
No acute cardiopulmonary process. Chronic interstitial changes.

Tech Notes:

Pt in ED due to fall. Syncope, Hx of heart valve replacement. AW

## 2018-09-29 ENCOUNTER — Encounter: Admit: 2018-09-29 | Discharge: 2018-09-29

## 2018-09-29 MED ORDER — LISINOPRIL 40 MG PO TAB
40 mg | ORAL_TABLET | Freq: Every day | ORAL | 2 refills | Status: DC
Start: 2018-09-29 — End: 2019-05-03

## 2018-10-19 ENCOUNTER — Encounter: Admit: 2018-10-19 | Discharge: 2018-10-19

## 2018-10-19 NOTE — Telephone Encounter
Rec'd vm Sharon at the pain clinic with Dr. Sharyon Cable, requesting approval from Dr. Lorriane Shire for pt to hold aspirin 7 days prior to a series of cervical epidurals. Pt would restart asa 1 day post epidural. Call back requested to [phone] option 3. Routed to Dr. Lorriane Shire for review and response.

## 2018-10-20 NOTE — Telephone Encounter
Connected with Ivin Booty at pain clinic with Dr. Cordella Register office.  Informed Ivin Booty that pt has a stent and a mitraclip so she needs to stay on Aspirin 81mg  indefinitely.  Ivin Booty verbally confirmed that pt can not hold asa prior to cervical epidural.  Ivin Booty has call back number with any other questions.

## 2018-11-06 LAB — LIPID PROFILE
Lab: 100 — ABNORMAL HIGH (ref <100)
Lab: 117
Lab: 191
Lab: 74
Lab: 85

## 2018-11-06 LAB — AST (SGOT): Lab: 23

## 2018-11-11 ENCOUNTER — Encounter: Admit: 2018-11-11 | Discharge: 2018-11-11

## 2018-11-11 DIAGNOSIS — I10 Essential (primary) hypertension: Secondary | ICD-10-CM

## 2018-11-11 DIAGNOSIS — I2510 Atherosclerotic heart disease of native coronary artery without angina pectoris: Secondary | ICD-10-CM

## 2018-11-11 DIAGNOSIS — E785 Hyperlipidemia, unspecified: Secondary | ICD-10-CM

## 2018-11-12 ENCOUNTER — Encounter: Admit: 2018-11-12 | Discharge: 2018-11-12

## 2018-11-12 MED ORDER — PRAVASTATIN 40 MG PO TAB
ORAL_TABLET | Freq: Every evening | ORAL | 3 refills | 90.00000 days | Status: DC
Start: 2018-11-12 — End: 2019-05-03

## 2018-11-17 ENCOUNTER — Encounter: Admit: 2018-11-17 | Discharge: 2018-11-17

## 2018-11-17 NOTE — Telephone Encounter
Patient called requesting cardiac clearance for cataract surgery to be done by Dr. Percell Miller in the next couple of months.      Will route to Dr.Wiley for clearance    Patient last seen by Dr. Lorriane Shire on March 30th, last echo was 01/2018 for mitral regurgitation s/p mitraclip.  EF 55%; other history consists of CAD, HTN, HLD, Chronic diastolic heart failure

## 2019-01-25 ENCOUNTER — Encounter

## 2019-01-25 DIAGNOSIS — I340 Nonrheumatic mitral (valve) insufficiency: Secondary | ICD-10-CM

## 2019-01-27 ENCOUNTER — Encounter: Admit: 2019-01-27 | Discharge: 2019-01-27 | Payer: MEDICARE

## 2019-01-27 ENCOUNTER — Ambulatory Visit: Admit: 2019-01-27 | Discharge: 2019-01-27 | Payer: MEDICARE

## 2019-01-27 DIAGNOSIS — R55 Syncope and collapse: Secondary | ICD-10-CM

## 2019-01-27 DIAGNOSIS — Z9889 Other specified postprocedural states: Secondary | ICD-10-CM

## 2019-01-27 DIAGNOSIS — I10 Essential (primary) hypertension: Secondary | ICD-10-CM

## 2019-01-27 DIAGNOSIS — E782 Mixed hyperlipidemia: Secondary | ICD-10-CM

## 2019-01-27 DIAGNOSIS — R002 Palpitations: Secondary | ICD-10-CM

## 2019-01-27 DIAGNOSIS — I493 Ventricular premature depolarization: Secondary | ICD-10-CM

## 2019-01-27 DIAGNOSIS — E785 Hyperlipidemia, unspecified: Secondary | ICD-10-CM

## 2019-01-27 DIAGNOSIS — J40 Bronchitis, not specified as acute or chronic: Secondary | ICD-10-CM

## 2019-01-27 DIAGNOSIS — I340 Nonrheumatic mitral (valve) insufficiency: Secondary | ICD-10-CM

## 2019-01-27 DIAGNOSIS — Z8719 Personal history of other diseases of the digestive system: Secondary | ICD-10-CM

## 2019-01-27 DIAGNOSIS — M1990 Unspecified osteoarthritis, unspecified site: Secondary | ICD-10-CM

## 2019-01-27 DIAGNOSIS — J302 Other seasonal allergic rhinitis: Secondary | ICD-10-CM

## 2019-01-27 DIAGNOSIS — I2510 Atherosclerotic heart disease of native coronary artery without angina pectoris: Secondary | ICD-10-CM

## 2019-01-27 DIAGNOSIS — N189 Chronic kidney disease, unspecified: Secondary | ICD-10-CM

## 2019-01-27 NOTE — Progress Notes
Telehealth Visit Note  Telehealth Patient Reported Vitals     Row Name 01/27/19 1311                Weight:  54.9 kg (121 lb)        Height:  1.499 m (4' 11)        Pain Score:  Zero            Date of Service: 01/27/2019    Subjective:      Obtained patient's verbal consent to treat them and their agreement to [facility] financial policy and NPP via this telehealth visit during the [facility] Emergency       Alison Fuller is a 78 y.o. female.    History of Present Illness    I had the pleasure of seeing Alison Fuller for 1 year post MitraClip follow-up.  She is a 78 year old with history of coronary artery disease, prior mitral valve repair with a 27 mm annuloplasty band, CKD, hyperlipidemia and hypertension.  She underwent deployment of a single MitraClip device in September 2019.  Her follow-up echo showed a slightly elevated mitral gradient with mild residual regurgitation.      She noticed symptomatic improvement following the procedure and has been doing well over the last year.  She walks on a daily basis.  She denies chest pain, shortness of breath, lower extremity edema, palpitations, near-syncope or syncope.  Her blood pressure stable at 120/60.           Review of Systems   Constitutional: Negative.    HENT: Negative.    Eyes: Negative.    Respiratory: Negative.    Cardiovascular: Negative.    Gastrointestinal: Negative.    Endocrine: Negative.    Genitourinary: Negative.    Musculoskeletal: Negative.    Skin: Negative.    Allergic/Immunologic: Negative.    Neurological: Negative.    Hematological: Negative.    Psychiatric/Behavioral: Negative.          Objective:         ? acetaminophen (TYLENOL) 325 mg tablet Take two tablets by mouth every 6 hours as needed.   ? aspirin EC 81 mg PO tablet Take 1 Tab by mouth Daily. (Patient taking differently: Take 81 mg by mouth at bedtime daily.)   ? Calcium-Magnesium-Zinc 333-133-5 mg tab Take 1 tablet by mouth daily. ? Cholecalciferol (Vitamin D3) (VITAMIN D-3) 2,000 unit cap Take 1 Cap by mouth daily.   ? coenzyme Q10(+) 100 mg cap Take 100 mg by mouth daily.   ? fish oil- omega 3-DHA/EPA 300/1,000 mg capsule Take 1 capsule by mouth daily.   ? hydroCHLOROthiazide (HYDRODIURIL) 25 mg tablet Take one tablet by mouth daily.   ? lisinopriL (ZESTRIL) 40 mg tablet Take one tablet by mouth daily.   ? nitroglycerin (NITROSTAT) 0.4 mg tablet Place one tablet under tongue as Needed for Chest Pain.   ? pantoprazole DR (PROTONIX) 40 mg tablet Take one tablet by mouth daily as needed (stomach pain).   ? pravastatin (PRAVACHOL) 40 mg tablet TAKE 1 TABLET AT BEDTIME.  NO REFILLS UNTIL LABS      DRAWN. SENDING PATIENT LAB LETTER AND LAB ORDER   ? vitamins, multiple tablet Take 1 Tab by mouth daily.       There were no vitals filed for this visit.  There is no height or weight on file to calculate BMI.     Telehealth Patient Reported Vitals     Row Name 01/27/19  1311                Weight:  54.9 kg (121 lb)        Height:  1.499 m (4' 11)        Pain Score:  Zero              Physical Exam  Telephone visit only       Assessment and Plan:    1.  Mitral regurgitation status post MitraClip x1 in September 2019.  She had an echocardiogram earlier this week that revealed an EF of 60%.  The clip was well-seated with mild residual regurgitation.  Her gradient is elevated around 9 mmHg.  Clinically she has been doing well with no cardiac symptoms.  We will continue to monitor.    2.  Chronic diastolic heart failure.  She has NYHA class I heart failure symptoms.  She denies symptoms of volume overload.    3.  Coronary artery disease.  She denies any anginal symptoms.  She needs aggressive risk factor modification.    4.  Dyslipidemia.  Her LDL is a little high on Pravachol 40 mg daily.  Unfortunately she has not tolerated atorvastatin or rosuvastatin.  She should continue lifestyle modification. She should follow-up with Dr. Paris Lore in 6 months or sooner if needed.  Thank you for allowing [place] to participate in the care of this pleasant individual.  If you have any other questions or concerns, please do not hesitate to contact us.    Sherrlyn Hock, APRN  Structural Heart Nurse Practitioner  Pager [phone]                         15 minutes spent on this patient's encounter with counseling and coordination of care taking >50% of the visit.

## 2019-02-01 MED ORDER — HYDROCHLOROTHIAZIDE 25 MG PO TAB
ORAL_TABLET | Freq: Every day | ORAL | 1 refills | 28.00000 days | Status: DC
Start: 2019-02-01 — End: 2019-05-03

## 2019-02-01 MED ORDER — PANTOPRAZOLE 40 MG PO TBEC
ORAL_TABLET | Freq: Every day | ORAL | 1 refills | 90.00000 days | Status: DC
Start: 2019-02-01 — End: 2019-08-19

## 2019-04-14 ENCOUNTER — Encounter: Admit: 2019-04-14 | Discharge: 2019-04-14 | Payer: MEDICARE

## 2019-05-03 ENCOUNTER — Encounter: Admit: 2019-05-03 | Discharge: 2019-05-03 | Payer: MEDICARE

## 2019-05-03 MED ORDER — LISINOPRIL 40 MG PO TAB
ORAL_TABLET | Freq: Every day | 2 refills | Status: DC
Start: 2019-05-03 — End: 2019-12-08

## 2019-05-03 MED ORDER — PRAVASTATIN 40 MG PO TAB
40 mg | ORAL_TABLET | Freq: Every evening | ORAL | 3 refills | 90.00000 days | Status: AC
Start: 2019-05-03 — End: ?

## 2019-05-03 MED ORDER — HYDROCHLOROTHIAZIDE 25 MG PO TAB
25 mg | ORAL_TABLET | Freq: Every day | ORAL | 3 refills | 28.00000 days | Status: DC
Start: 2019-05-03 — End: 2019-05-10

## 2019-05-10 ENCOUNTER — Encounter: Admit: 2019-05-10 | Discharge: 2019-05-10 | Payer: MEDICARE

## 2019-05-10 MED ORDER — HYDROCHLOROTHIAZIDE 25 MG PO TAB
25 mg | ORAL_TABLET | Freq: Every day | ORAL | 3 refills | 28.00000 days | Status: AC
Start: 2019-05-10 — End: ?

## 2019-07-26 ENCOUNTER — Ambulatory Visit: Admit: 2019-07-26 | Discharge: 2019-07-26 | Payer: MEDICARE

## 2019-07-26 ENCOUNTER — Encounter: Admit: 2019-07-26 | Discharge: 2019-07-26 | Payer: MEDICARE

## 2019-07-26 DIAGNOSIS — R002 Palpitations: Secondary | ICD-10-CM

## 2019-07-26 DIAGNOSIS — I493 Ventricular premature depolarization: Secondary | ICD-10-CM

## 2019-07-26 DIAGNOSIS — Z8719 Personal history of other diseases of the digestive system: Secondary | ICD-10-CM

## 2019-07-26 DIAGNOSIS — I10 Essential (primary) hypertension: Secondary | ICD-10-CM

## 2019-07-26 DIAGNOSIS — E785 Hyperlipidemia, unspecified: Secondary | ICD-10-CM

## 2019-07-26 DIAGNOSIS — M1990 Unspecified osteoarthritis, unspecified site: Secondary | ICD-10-CM

## 2019-07-26 DIAGNOSIS — R55 Syncope and collapse: Secondary | ICD-10-CM

## 2019-07-26 DIAGNOSIS — I2510 Atherosclerotic heart disease of native coronary artery without angina pectoris: Secondary | ICD-10-CM

## 2019-07-26 DIAGNOSIS — I340 Nonrheumatic mitral (valve) insufficiency: Secondary | ICD-10-CM

## 2019-07-26 DIAGNOSIS — J302 Other seasonal allergic rhinitis: Secondary | ICD-10-CM

## 2019-07-26 DIAGNOSIS — J40 Bronchitis, not specified as acute or chronic: Secondary | ICD-10-CM

## 2019-07-26 DIAGNOSIS — N189 Chronic kidney disease, unspecified: Secondary | ICD-10-CM

## 2019-07-26 NOTE — Progress Notes
Telehealth Visit Note    Date of Service: 07/26/2019    Subjective:      Obtained patient's verbal consent to treat them and their agreement to [facility] financial policy and NPP via this telehealth visit during the [facility] Emergency        Telehealth Patient Reported Vitals     Row Name 07/26/19 0857                BP:  123/65        BP Source:  Arm, Left Lower        BP Position:  Sitting        BP Method:  Automatic/Digital Reading        Pulse:  74        Weight:  50.8 kg (112 lb)        Height:  1.524 m (5')        Pain Score:  Zero            Alison Fuller is a 79 y.o. female.    History of Present Illness  Alison Fuller is an 79 year old female who I am seeing via telehealth today.  She stated that she is not having any cardiovascular complaints over the last couple months, however has had some stomach discomfort that occurs intermittently.  She does not associate this with meals and it does not occur every day.  She denies any unintended weight loss and is planning to see her primary care physician for follow-up.  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  She denies exertional shortness of breath at the current time and overall is getting along well.  We did initiate pravastatin for cholesterol management and her most recent fasting lipid profile has trended more favorable with an LDL of 91 and an HDL of 75.  Overall, from a cardiovascular standpoint, she has no other new subjective complaints.       Review of Systems   Constitutional: Negative.    HENT: Negative.    Eyes: Negative.    Respiratory: Negative.    Cardiovascular: Negative.    Gastrointestinal:        Stomach gets kinda raw at times.   Endocrine: Negative.    Genitourinary: Negative.    Musculoskeletal: Negative.    Skin: Negative.    Allergic/Immunologic: Negative.    Neurological: Negative.    Hematological: Negative.    Psychiatric/Behavioral: Negative.          Objective:         ? acetaminophen (TYLENOL) 325 mg tablet Take two tablets by mouth every 6 hours as needed.   ? aspirin EC 81 mg PO tablet Take 1 Tab by mouth Daily. (Patient taking differently: Take 81 mg by mouth at bedtime daily.)   ? Calcium-Magnesium-Zinc 333-133-5 mg tab Take 1 tablet by mouth daily.   ? Cholecalciferol (Vitamin D3) (VITAMIN D-3) 2,000 unit cap Take 1 Cap by mouth daily.   ? coenzyme Q10(+) 100 mg cap Take 100 mg by mouth daily.   ? fish oil- omega 3-DHA/EPA 300/1,000 mg capsule Take 1 capsule by mouth daily.   ? hydroCHLOROthiazide (HYDRODIURIL) 25 mg tablet Take one tablet by mouth daily.   ? lisinopriL (ZESTRIL) 40 mg tablet TAKE 1 TABLET DAILY   ? nitroglycerin (NITROSTAT) 0.4 mg tablet Place one tablet under tongue as Needed for Chest Pain.   ? pantoprazole DR (PROTONIX) 40 mg tablet TAKE 1  TABLET DAILY AS     NEEDED FOR STOMACH PAIN   ? pravastatin (PRAVACHOL) 40 mg tablet Take one tablet by mouth at bedtime daily.   ? vitamins, multiple tablet Take 1 Tab by mouth daily.       There were no vitals filed for this visit.  There is no height or weight on file to calculate BMI.     Telehealth Patient Reported Vitals     Row Name 07/26/19 0857                BP:  123/65        BP Source:  Arm, Left Lower        BP Position:  Sitting        BP Method:  Automatic/Digital Reading        Pulse:  74        Weight:  50.8 kg (112 lb)        Height:  1.524 m (5')        Pain Score:  Zero                 Assessment and Plan:  1. Coronary artery disease-I would like for her to remain on an 81 mg tablet of aspirin.  We have focused on optimizing her cholesterol management and she is currently on pravastatin 40 mg at bedtime.  Her most recent fasting lipid profile demonstrated an LDL of 91 with an HDL of 75.  Not going to make any adjustments to her current regimen.  2. Hypertension-her blood pressure has been under good control and her reported blood pressure today suggest adequate control on lisinopril and hydrochlorothiazide.  Not can make any adjustments to her regimen at the current time.  3. Mitral valve disease-she had an echocardiogram back in September which suggested some mitral stenosis.  She is asymptomatic and we will plan to repeat an echocardiogram in 6 months for her annual surveillance imaging.    Thanks for allowing [place] to participate in her care.     15 minutes spent on this patient's encounter with counseling and coordination of care taking >50% of the visit.

## 2019-08-19 MED ORDER — PANTOPRAZOLE 40 MG PO TBEC
40 mg | ORAL_TABLET | ORAL | 3 refills | 90.00000 days | Status: AC | PRN
Start: 2019-08-19 — End: ?

## 2019-10-26 ENCOUNTER — Encounter: Admit: 2019-10-26 | Discharge: 2019-10-26 | Payer: MEDICARE

## 2019-10-26 NOTE — Telephone Encounter
Received call from [facility] at Dr. Marcos Eke office.  Annabelle Harman states patient is scheduled for ptosis repair on 7/21 and Dr. Victorino Dike is requesting authorization from Dr. Paris Lore to hold fish oil and aspirin for 3 weeks prior to the procedure.     Patient recently had telehealth follow up with Dr. Paris Lore on 07/26/19 for mitral valve disease and CAD.  Patient had mitral clips placed 12/2017.     Will route to MAW and Clydie Braun, Charity fundraiser for follow up and recommendations.    Dr. Victorino Dike ph# [phone] to call with recommendations.

## 2019-10-27 ENCOUNTER — Encounter: Admit: 2019-10-27 | Discharge: 2019-10-27 | Payer: MEDICARE

## 2019-12-08 ENCOUNTER — Encounter: Admit: 2019-12-08 | Discharge: 2019-12-08 | Payer: MEDICARE

## 2019-12-08 MED ORDER — LISINOPRIL 40 MG PO TAB
ORAL_TABLET | Freq: Every day | 2 refills | Status: AC
Start: 2019-12-08 — End: ?

## 2020-01-26 ENCOUNTER — Encounter: Admit: 2020-01-26 | Discharge: 2020-01-26 | Payer: MEDICARE

## 2020-01-26 ENCOUNTER — Ambulatory Visit: Admit: 2020-01-26 | Discharge: 2020-01-26 | Payer: MEDICARE

## 2020-01-26 DIAGNOSIS — I340 Nonrheumatic mitral (valve) insufficiency: Secondary | ICD-10-CM

## 2020-01-26 DIAGNOSIS — I2510 Atherosclerotic heart disease of native coronary artery without angina pectoris: Secondary | ICD-10-CM

## 2020-01-28 ENCOUNTER — Encounter: Admit: 2020-01-28 | Discharge: 2020-01-28 | Payer: MEDICARE

## 2020-01-28 DIAGNOSIS — I493 Ventricular premature depolarization: Secondary | ICD-10-CM

## 2020-01-28 DIAGNOSIS — I340 Nonrheumatic mitral (valve) insufficiency: Secondary | ICD-10-CM

## 2020-01-28 DIAGNOSIS — I2510 Atherosclerotic heart disease of native coronary artery without angina pectoris: Secondary | ICD-10-CM

## 2020-01-28 DIAGNOSIS — I050 Rheumatic mitral stenosis: Secondary | ICD-10-CM

## 2020-01-28 DIAGNOSIS — J40 Bronchitis, not specified as acute or chronic: Secondary | ICD-10-CM

## 2020-01-28 DIAGNOSIS — N189 Chronic kidney disease, unspecified: Secondary | ICD-10-CM

## 2020-01-28 DIAGNOSIS — Z9889 Other specified postprocedural states: Secondary | ICD-10-CM

## 2020-01-28 DIAGNOSIS — M1990 Unspecified osteoarthritis, unspecified site: Secondary | ICD-10-CM

## 2020-01-28 DIAGNOSIS — Z8711 Personal history of peptic ulcer disease: Secondary | ICD-10-CM

## 2020-01-28 DIAGNOSIS — R55 Syncope and collapse: Secondary | ICD-10-CM

## 2020-01-28 DIAGNOSIS — I10 Essential (primary) hypertension: Secondary | ICD-10-CM

## 2020-01-28 DIAGNOSIS — J302 Other seasonal allergic rhinitis: Secondary | ICD-10-CM

## 2020-01-28 DIAGNOSIS — E785 Hyperlipidemia, unspecified: Secondary | ICD-10-CM

## 2020-01-28 DIAGNOSIS — R002 Palpitations: Secondary | ICD-10-CM

## 2020-01-28 NOTE — Patient Instructions
Follow up in 6 months with an Echo of your heart

## 2020-04-24 ENCOUNTER — Encounter: Admit: 2020-04-24 | Discharge: 2020-04-24 | Payer: MEDICARE

## 2020-04-24 MED ORDER — PRAVASTATIN 40 MG PO TAB
ORAL_TABLET | Freq: Every evening | ORAL | 3 refills | 90.00000 days | Status: AC
Start: 2020-04-24 — End: ?

## 2020-04-24 MED ORDER — HYDROCHLOROTHIAZIDE 25 MG PO TAB
ORAL_TABLET | Freq: Every day | ORAL | 3 refills | 28.00000 days | Status: AC
Start: 2020-04-24 — End: ?

## 2020-06-05 ENCOUNTER — Encounter: Admit: 2020-06-05 | Discharge: 2020-06-05 | Payer: PRIVATE HEALTH INSURANCE

## 2020-06-05 MED ORDER — LISINOPRIL 40 MG PO TAB
40 mg | ORAL_TABLET | Freq: Every day | ORAL | 3 refills | Status: AC
Start: 2020-06-05 — End: ?

## 2020-06-05 MED ORDER — PANTOPRAZOLE 40 MG PO TBEC
40 mg | ORAL_TABLET | ORAL | 3 refills | 90.00000 days | Status: AC | PRN
Start: 2020-06-05 — End: ?

## 2020-09-14 ENCOUNTER — Encounter: Admit: 2020-09-14 | Discharge: 2020-09-14 | Payer: MEDICARE

## 2020-09-14 NOTE — Progress Notes
Records Request-STAT    Medical records request for continuation of care:    Patient has appointment TOMORROW   with  Dr.Rosamond  .    Please fax records to [facility] [phone]    Request records:        Most Recent Labs        Thank you,      Cardiovascular Medicine  [facility]  [address]  [place], [place] [address]  Phone:  [phone]  Fax:  [phone]

## 2020-10-12 ENCOUNTER — Encounter: Admit: 2020-10-12 | Discharge: 2020-10-12 | Payer: MEDICARE

## 2020-10-12 NOTE — Progress Notes
Records Request    Medical records request for continuation of care:    Patient has appointment  with  Dr. Rosamond .    Please fax records to Cardiovascular Medicine [facility] [phone]    Request records:        Most Recent Labs        Thank you,      Cardiovascular Medicine  [facility]  [address] [place] [address]  Phone:  [phone]  Fax:  [phone]

## 2020-10-13 ENCOUNTER — Encounter: Admit: 2020-10-13 | Discharge: 2020-10-13 | Payer: MEDICARE

## 2020-10-13 DIAGNOSIS — I340 Nonrheumatic mitral (valve) insufficiency: Secondary | ICD-10-CM

## 2020-10-13 DIAGNOSIS — I2510 Atherosclerotic heart disease of native coronary artery without angina pectoris: Secondary | ICD-10-CM

## 2020-10-13 DIAGNOSIS — I050 Rheumatic mitral stenosis: Secondary | ICD-10-CM

## 2020-10-13 DIAGNOSIS — Z9889 Other specified postprocedural states: Secondary | ICD-10-CM

## 2020-10-23 ENCOUNTER — Encounter: Admit: 2020-10-23 | Discharge: 2020-10-23 | Payer: MEDICARE

## 2020-10-23 DIAGNOSIS — I2510 Atherosclerotic heart disease of native coronary artery without angina pectoris: Secondary | ICD-10-CM

## 2020-10-23 DIAGNOSIS — M1990 Unspecified osteoarthritis, unspecified site: Secondary | ICD-10-CM

## 2020-10-23 DIAGNOSIS — R55 Syncope and collapse: Secondary | ICD-10-CM

## 2020-10-23 DIAGNOSIS — R002 Palpitations: Secondary | ICD-10-CM

## 2020-10-23 DIAGNOSIS — J302 Other seasonal allergic rhinitis: Secondary | ICD-10-CM

## 2020-10-23 DIAGNOSIS — J40 Bronchitis, not specified as acute or chronic: Secondary | ICD-10-CM

## 2020-10-23 DIAGNOSIS — I10 Essential (primary) hypertension: Secondary | ICD-10-CM

## 2020-10-23 DIAGNOSIS — Z8711 Personal history of peptic ulcer disease: Secondary | ICD-10-CM

## 2020-10-23 DIAGNOSIS — I493 Ventricular premature depolarization: Secondary | ICD-10-CM

## 2020-10-23 DIAGNOSIS — E785 Hyperlipidemia, unspecified: Secondary | ICD-10-CM

## 2020-10-23 DIAGNOSIS — N189 Chronic kidney disease, unspecified: Secondary | ICD-10-CM

## 2020-10-23 DIAGNOSIS — I340 Nonrheumatic mitral (valve) insufficiency: Secondary | ICD-10-CM

## 2020-10-31 ENCOUNTER — Encounter: Admit: 2020-10-31 | Discharge: 2020-10-31 | Payer: MEDICARE

## 2020-10-31 NOTE — Telephone Encounter
-----   Message from Ishmael Holter, RN sent at 10/31/2020  8:38 AM CDT -----    ----- Message -----  From: Laurence Aly, MD  Sent: 10/28/2020   8:27 AM CDT  To: Renetta Chalk, RN    Let her know about the same but be sure we have follow up arranged as per my OV.

## 2020-10-31 NOTE — Telephone Encounter
Left vm with echo results and callback number for further questions or concerns.

## 2020-12-08 ENCOUNTER — Encounter: Admit: 2020-12-08 | Discharge: 2020-12-08 | Payer: MEDICARE

## 2020-12-08 MED ORDER — PRAVACHOL 40 MG PO TAB
ORAL_TABLET | Freq: Every evening | ORAL | 3 refills | 90.00000 days | Status: AC
Start: 2020-12-08 — End: ?

## 2020-12-08 MED ORDER — PRAVASTATIN 40 MG PO TAB
40 mg | ORAL_TABLET | Freq: Every evening | ORAL | 3 refills | 90.00000 days | Status: DC
Start: 2020-12-08 — End: 2020-12-08

## 2020-12-25 ENCOUNTER — Encounter: Admit: 2020-12-25 | Discharge: 2020-12-25 | Payer: MEDICARE

## 2020-12-25 MED ORDER — HYDROCHLOROTHIAZIDE 25 MG PO TAB
ORAL_TABLET | Freq: Every day | ORAL | 3 refills | 28.00000 days | Status: AC
Start: 2020-12-25 — End: ?

## 2020-12-25 NOTE — Telephone Encounter
Received a request via computer from the patients pharmacy requesting a refill.  Script e-scribed as requested.

## 2021-04-12 ENCOUNTER — Encounter: Admit: 2021-04-12 | Discharge: 2021-04-12 | Payer: MEDICARE

## 2021-04-20 ENCOUNTER — Encounter: Admit: 2021-04-20 | Discharge: 2021-04-20 | Payer: MEDICARE

## 2021-04-20 ENCOUNTER — Ambulatory Visit: Admit: 2021-04-20 | Discharge: 2021-04-20 | Payer: MEDICARE

## 2021-04-20 DIAGNOSIS — Z136 Encounter for screening for cardiovascular disorders: Secondary | ICD-10-CM

## 2021-04-20 DIAGNOSIS — E782 Mixed hyperlipidemia: Secondary | ICD-10-CM

## 2021-04-20 DIAGNOSIS — I10 Essential (primary) hypertension: Secondary | ICD-10-CM

## 2021-04-20 DIAGNOSIS — J302 Other seasonal allergic rhinitis: Secondary | ICD-10-CM

## 2021-04-20 DIAGNOSIS — I340 Nonrheumatic mitral (valve) insufficiency: Secondary | ICD-10-CM

## 2021-04-20 DIAGNOSIS — N189 Chronic kidney disease, unspecified: Secondary | ICD-10-CM

## 2021-04-20 DIAGNOSIS — Z9889 Other specified postprocedural states: Secondary | ICD-10-CM

## 2021-04-20 DIAGNOSIS — R0609 Other forms of dyspnea: Secondary | ICD-10-CM

## 2021-04-20 DIAGNOSIS — I493 Ventricular premature depolarization: Secondary | ICD-10-CM

## 2021-04-20 DIAGNOSIS — E876 Hypokalemia: Secondary | ICD-10-CM

## 2021-04-20 DIAGNOSIS — E785 Hyperlipidemia, unspecified: Secondary | ICD-10-CM

## 2021-04-20 DIAGNOSIS — J40 Bronchitis, not specified as acute or chronic: Secondary | ICD-10-CM

## 2021-04-20 DIAGNOSIS — M1990 Unspecified osteoarthritis, unspecified site: Secondary | ICD-10-CM

## 2021-04-20 DIAGNOSIS — R55 Syncope and collapse: Secondary | ICD-10-CM

## 2021-04-20 DIAGNOSIS — I2510 Atherosclerotic heart disease of native coronary artery without angina pectoris: Secondary | ICD-10-CM

## 2021-04-20 DIAGNOSIS — R002 Palpitations: Secondary | ICD-10-CM

## 2021-04-20 DIAGNOSIS — R079 Chest pain, unspecified: Secondary | ICD-10-CM

## 2021-04-20 DIAGNOSIS — Z8711 Personal history of peptic ulcer disease: Secondary | ICD-10-CM

## 2021-04-20 DIAGNOSIS — R0602 Shortness of breath: Secondary | ICD-10-CM

## 2021-04-20 MED ORDER — LISINOPRIL 40 MG PO TAB
40 mg | ORAL_TABLET | Freq: Every day | ORAL | 3 refills | Status: AC
Start: 2021-04-20 — End: ?

## 2021-04-20 MED ORDER — PRAVASTATIN 40 MG PO TAB
40 mg | ORAL_TABLET | Freq: Every evening | ORAL | 3 refills | 90.00000 days | Status: AC
Start: 2021-04-20 — End: ?

## 2021-04-20 MED ORDER — NITROGLYCERIN 0.4 MG SL SUBL
0.4 mg | ORAL_TABLET | SUBLINGUAL | 2 refills | 9.00000 days | Status: AC | PRN
Start: 2021-04-20 — End: ?

## 2021-04-20 MED ORDER — HYDROCHLOROTHIAZIDE 25 MG PO TAB
25 mg | ORAL_TABLET | Freq: Every day | ORAL | 3 refills | 28.00000 days | Status: AC
Start: 2021-04-20 — End: ?

## 2021-04-20 NOTE — Patient Instructions
Thank you for visiting our office today.    We would like to make the following medication adjustments:  NONE      Otherwise continue the same medications as you have been doing.          We will be pursuing the following tests after your appointment today:       Orders Placed This Encounter    BNP (B-TYPE NATRIURETIC PEPTI)    ECG 12-LEAD         We will plan to see you back in 4-5 months.  Please call us in the meantime with any questions or concerns.        Please allow 5-7 business days for our providers to review your results. All normal results will go to MyChart. If you do not have Mychart, it is strongly recommended to get this so you can easily view all your results. If you do not have mychart, we will attempt to call you once with normal lab and testing results. If we cannot reach you by phone with normal results, we will send you a letter.  If you have not heard the results of your testing after one week please give [place] a call.       Your Cardiovascular Medicine [place] [facility] Team Brett Canales, Pilar Jarvis, Shawna Orleans, and [place])  phone number is [phone].

## 2021-05-01 ENCOUNTER — Encounter: Admit: 2021-05-01 | Discharge: 2021-05-01 | Payer: MEDICARE

## 2021-05-01 DIAGNOSIS — Z136 Encounter for screening for cardiovascular disorders: Secondary | ICD-10-CM

## 2021-05-01 DIAGNOSIS — I10 Essential (primary) hypertension: Secondary | ICD-10-CM

## 2021-05-01 DIAGNOSIS — R079 Chest pain, unspecified: Secondary | ICD-10-CM

## 2021-05-01 DIAGNOSIS — E782 Mixed hyperlipidemia: Secondary | ICD-10-CM

## 2021-05-01 DIAGNOSIS — R0609 Other forms of dyspnea: Secondary | ICD-10-CM

## 2021-05-01 DIAGNOSIS — I2510 Atherosclerotic heart disease of native coronary artery without angina pectoris: Secondary | ICD-10-CM

## 2021-05-01 DIAGNOSIS — Z9889 Other specified postprocedural states: Secondary | ICD-10-CM

## 2021-05-03 ENCOUNTER — Encounter: Admit: 2021-05-03 | Discharge: 2021-05-03 | Payer: MEDICARE

## 2021-05-29 ENCOUNTER — Encounter: Admit: 2021-05-29 | Discharge: 2021-05-29 | Payer: MEDICARE

## 2021-08-01 ENCOUNTER — Encounter: Admit: 2021-08-01 | Discharge: 2021-08-01 | Payer: MEDICARE | Primary: Primary Care

## 2021-08-01 DIAGNOSIS — I10 Essential (primary) hypertension: Secondary | ICD-10-CM

## 2021-08-01 DIAGNOSIS — N189 Chronic kidney disease, unspecified: Secondary | ICD-10-CM

## 2021-08-01 DIAGNOSIS — J40 Bronchitis, not specified as acute or chronic: Secondary | ICD-10-CM

## 2021-08-01 DIAGNOSIS — R0609 Other forms of dyspnea: Secondary | ICD-10-CM

## 2021-08-01 DIAGNOSIS — I493 Ventricular premature depolarization: Secondary | ICD-10-CM

## 2021-08-01 DIAGNOSIS — E782 Mixed hyperlipidemia: Secondary | ICD-10-CM

## 2021-08-01 DIAGNOSIS — M1990 Unspecified osteoarthritis, unspecified site: Secondary | ICD-10-CM

## 2021-08-01 DIAGNOSIS — I2510 Atherosclerotic heart disease of native coronary artery without angina pectoris: Secondary | ICD-10-CM

## 2021-08-01 DIAGNOSIS — R55 Syncope and collapse: Secondary | ICD-10-CM

## 2021-08-01 DIAGNOSIS — R002 Palpitations: Secondary | ICD-10-CM

## 2021-08-01 DIAGNOSIS — E785 Hyperlipidemia, unspecified: Secondary | ICD-10-CM

## 2021-08-01 DIAGNOSIS — J302 Other seasonal allergic rhinitis: Secondary | ICD-10-CM

## 2021-08-01 DIAGNOSIS — I340 Nonrheumatic mitral (valve) insufficiency: Secondary | ICD-10-CM

## 2021-08-01 DIAGNOSIS — Z8711 Personal history of peptic ulcer disease: Secondary | ICD-10-CM

## 2021-08-01 NOTE — Patient Instructions
Thank you for visiting our office today.    We would like to make the following medication adjustments:  NONE      Otherwise continue the same medications as you have been doing.          We will be pursuing the following tests after your appointment today:       Orders Placed This Encounter    2D + DOPPLER ECHO         We will plan to see you back in 6 months.  Please call us in the meantime with any questions or concerns.        Please allow 5-7 business days for our providers to review your results. All normal results will go to MyChart. If you do not have Mychart, it is strongly recommended to get this so you can easily view all your results. If you do not have mychart, we will attempt to call you once with normal lab and testing results. If we cannot reach you by phone with normal results, we will send you a letter.  If you have not heard the results of your testing after one week please give us a call.       Your Cardiovascular Medicine [place] Team (Steve, Lisa, Jamie, Melanie, and Nevea Spiewak)  phone number is [phone].

## 2021-10-15 ENCOUNTER — Ambulatory Visit: Admit: 2021-10-15 | Discharge: 2021-10-15 | Payer: MEDICARE | Primary: Primary Care

## 2021-10-15 ENCOUNTER — Encounter: Admit: 2021-10-15 | Discharge: 2021-10-15 | Payer: MEDICARE | Primary: Primary Care

## 2021-10-15 DIAGNOSIS — R0609 Other forms of dyspnea: Secondary | ICD-10-CM

## 2021-10-15 DIAGNOSIS — E782 Mixed hyperlipidemia: Secondary | ICD-10-CM

## 2021-10-15 DIAGNOSIS — I2510 Atherosclerotic heart disease of native coronary artery without angina pectoris: Secondary | ICD-10-CM

## 2021-10-15 DIAGNOSIS — I10 Essential (primary) hypertension: Secondary | ICD-10-CM

## 2021-11-16 ENCOUNTER — Encounter: Admit: 2021-11-16 | Discharge: 2021-11-16 | Payer: MEDICARE | Primary: Primary Care

## 2021-11-19 ENCOUNTER — Encounter: Admit: 2021-11-19 | Discharge: 2021-11-19 | Payer: MEDICARE | Primary: Primary Care

## 2021-11-19 NOTE — Telephone Encounter
-----   Message from Glade Stanford, RN sent at 11/19/2021 10:22 AM CDT -----    ----- Message -----  From: Laurence Aly, MD  Sent: 11/16/2021   5:40 PM CDT  To: Glade Stanford, RN    Valve still leaking let shave her in to talk with me about plans about her valve to discuss

## 2022-03-18 ENCOUNTER — Encounter: Admit: 2022-03-18 | Discharge: 2022-03-18 | Payer: MEDICARE | Primary: Primary Care

## 2022-03-28 ENCOUNTER — Encounter: Admit: 2022-03-28 | Discharge: 2022-03-28 | Payer: MEDICARE | Primary: Primary Care

## 2022-03-28 DIAGNOSIS — I2510 Atherosclerotic heart disease of native coronary artery without angina pectoris: Secondary | ICD-10-CM

## 2022-03-28 DIAGNOSIS — E782 Mixed hyperlipidemia: Secondary | ICD-10-CM

## 2022-03-28 DIAGNOSIS — I10 Essential (primary) hypertension: Secondary | ICD-10-CM

## 2022-03-28 NOTE — Telephone Encounter
-----   Message from Ishmael Holter, RN sent at 03/28/2022  9:28 AM CST -----  Regarding: FW: Scheduling    ----- Message -----  From: Juventino Slovak  Sent: 03/28/2022   9:11 AM CST  To: Cvm Nurse Gen Card Team [place]  Subject: Scheduling                                       Hello        Patient needing to see TLR, there are no open appointments please advise. Thank you.

## 2022-04-04 ENCOUNTER — Encounter: Admit: 2022-04-04 | Discharge: 2022-04-04 | Payer: MEDICARE | Primary: Primary Care

## 2022-04-17 ENCOUNTER — Encounter: Admit: 2022-04-17 | Discharge: 2022-04-17 | Payer: MEDICARE | Primary: Primary Care

## 2022-04-17 DIAGNOSIS — E782 Mixed hyperlipidemia: Secondary | ICD-10-CM

## 2022-04-17 DIAGNOSIS — I10 Essential (primary) hypertension: Secondary | ICD-10-CM

## 2022-04-17 DIAGNOSIS — R002 Palpitations: Secondary | ICD-10-CM

## 2022-04-17 DIAGNOSIS — I340 Nonrheumatic mitral (valve) insufficiency: Secondary | ICD-10-CM

## 2022-04-17 DIAGNOSIS — Z8711 Personal history of peptic ulcer disease: Secondary | ICD-10-CM

## 2022-04-17 DIAGNOSIS — E785 Hyperlipidemia, unspecified: Secondary | ICD-10-CM

## 2022-04-17 DIAGNOSIS — M1990 Unspecified osteoarthritis, unspecified site: Secondary | ICD-10-CM

## 2022-04-17 DIAGNOSIS — J40 Bronchitis, not specified as acute or chronic: Secondary | ICD-10-CM

## 2022-04-17 DIAGNOSIS — I5032 Chronic diastolic (congestive) heart failure: Secondary | ICD-10-CM

## 2022-04-17 DIAGNOSIS — R0609 Other forms of dyspnea: Secondary | ICD-10-CM

## 2022-04-17 DIAGNOSIS — I493 Ventricular premature depolarization: Secondary | ICD-10-CM

## 2022-04-17 DIAGNOSIS — J302 Other seasonal allergic rhinitis: Secondary | ICD-10-CM

## 2022-04-17 DIAGNOSIS — I2510 Atherosclerotic heart disease of native coronary artery without angina pectoris: Secondary | ICD-10-CM

## 2022-04-17 DIAGNOSIS — R079 Chest pain, unspecified: Secondary | ICD-10-CM

## 2022-04-17 DIAGNOSIS — N189 Chronic kidney disease, unspecified: Secondary | ICD-10-CM

## 2022-04-17 DIAGNOSIS — R55 Syncope and collapse: Secondary | ICD-10-CM

## 2022-04-17 NOTE — Patient Instructions
Thank you for visiting our office today.    We would like to make the following medication adjustments:         Otherwise continue the same medications as you have been doing.          We will be pursuing the following tests after your appointment today:       Orders Placed This Encounter    BNP (B-TYPE NATRIURETIC PEPTI)         We will plan to see you back in 6 months.  Please call us in the meantime with any questions or concerns.        Please allow 5-7 business days for our providers to review your results. All normal results will go to MyChart. If you do not have Mychart, it is strongly recommended to get this so you can easily view all your results. If you do not have mychart, we will attempt to call you once with normal lab and testing results. If we cannot reach you by phone with normal results, we will send you a letter.  If you have not heard the results of your testing after one week please give [place] a call.       Your Cardiovascular [facility] [place] Team Brett Canales, Pilar Jarvis, Shawna Orleans, and [place])  phone number is [phone].

## 2022-05-01 ENCOUNTER — Encounter: Admit: 2022-05-01 | Discharge: 2022-05-01 | Payer: MEDICARE | Primary: Primary Care

## 2022-05-01 DIAGNOSIS — E782 Mixed hyperlipidemia: Secondary | ICD-10-CM

## 2022-05-01 DIAGNOSIS — R0609 Other forms of dyspnea: Secondary | ICD-10-CM

## 2022-05-01 DIAGNOSIS — R079 Chest pain, unspecified: Secondary | ICD-10-CM

## 2022-05-01 DIAGNOSIS — I2510 Atherosclerotic heart disease of native coronary artery without angina pectoris: Secondary | ICD-10-CM

## 2022-05-01 DIAGNOSIS — I10 Essential (primary) hypertension: Secondary | ICD-10-CM

## 2022-05-14 ENCOUNTER — Encounter: Admit: 2022-05-14 | Discharge: 2022-05-14 | Payer: MEDICARE | Primary: Primary Care

## 2022-05-14 MED ORDER — LISINOPRIL 40 MG PO TAB
40 mg | ORAL_TABLET | Freq: Every day | ORAL | 3 refills | Status: AC
Start: 2022-05-14 — End: ?

## 2022-05-14 MED ORDER — PRAVASTATIN 40 MG PO TAB
40 mg | ORAL_TABLET | Freq: Every evening | ORAL | 3 refills | 90.00000 days | Status: AC
Start: 2022-05-14 — End: ?

## 2022-05-14 MED ORDER — HYDROCHLOROTHIAZIDE 25 MG PO TAB
25 mg | ORAL_TABLET | Freq: Every day | ORAL | 3 refills | 28.00000 days | Status: AC
Start: 2022-05-14 — End: ?

## 2022-05-14 MED ORDER — LISINOPRIL 40 MG PO TAB
40 mg | ORAL_TABLET | Freq: Every day | 3 refills
Start: 2022-05-14 — End: ?

## 2022-05-28 ENCOUNTER — Encounter: Admit: 2022-05-28 | Discharge: 2022-05-28 | Payer: MEDICARE | Primary: Primary Care

## 2022-06-11 ENCOUNTER — Encounter: Admit: 2022-06-11 | Discharge: 2022-06-11 | Payer: MEDICARE | Primary: Primary Care

## 2022-06-11 NOTE — Telephone Encounter
Received a call on nursing line from nurse at the patient's PCP office requesting a call back. Patient was in the office today for a routine follow up. Patient had an abnormal EKG in the office and is having shortness of breath. Spoke with Baldo Ash, nurse with [facility], NP and advised patient to go to emergency room for evaluation and treatment.

## 2022-06-11 NOTE — Telephone Encounter
Patient returned to her home and called nursing line. She reports the shortness of breath comes and goes and is mainly when she exerts herself and is not new. Denies any palpitation, chest pain, lightheaded/dizziness or swelling. She states she does not want to go to the ER at this time.     PCP office faxed EKG. Dr. Lafe Garin reviewed and has no emergent concerns.    Discussed with patient. Reviewed when to seek emergent care. Confirmed d/t/l of scheduled appt with TLR. Patient will call for further questions or concerns.

## 2022-06-21 ENCOUNTER — Encounter: Admit: 2022-06-21 | Discharge: 2022-06-21 | Payer: MEDICARE | Primary: Primary Care

## 2022-06-21 DIAGNOSIS — I10 Essential (primary) hypertension: Secondary | ICD-10-CM

## 2022-06-21 DIAGNOSIS — E782 Mixed hyperlipidemia: Secondary | ICD-10-CM

## 2022-06-21 DIAGNOSIS — R0609 Other forms of dyspnea: Secondary | ICD-10-CM

## 2022-06-21 DIAGNOSIS — I2510 Atherosclerotic heart disease of native coronary artery without angina pectoris: Secondary | ICD-10-CM

## 2022-06-21 DIAGNOSIS — E785 Hyperlipidemia, unspecified: Secondary | ICD-10-CM

## 2022-06-21 DIAGNOSIS — I340 Nonrheumatic mitral (valve) insufficiency: Secondary | ICD-10-CM

## 2022-06-21 DIAGNOSIS — R002 Palpitations: Secondary | ICD-10-CM

## 2022-06-21 DIAGNOSIS — M1990 Unspecified osteoarthritis, unspecified site: Secondary | ICD-10-CM

## 2022-06-21 DIAGNOSIS — J302 Other seasonal allergic rhinitis: Secondary | ICD-10-CM

## 2022-06-21 DIAGNOSIS — I5032 Chronic diastolic (congestive) heart failure: Secondary | ICD-10-CM

## 2022-06-21 DIAGNOSIS — R55 Syncope and collapse: Secondary | ICD-10-CM

## 2022-06-21 DIAGNOSIS — Z8711 Personal history of peptic ulcer disease: Secondary | ICD-10-CM

## 2022-06-21 DIAGNOSIS — N189 Chronic kidney disease, unspecified: Secondary | ICD-10-CM

## 2022-06-21 DIAGNOSIS — I493 Ventricular premature depolarization: Secondary | ICD-10-CM

## 2022-06-21 DIAGNOSIS — J40 Bronchitis, not specified as acute or chronic: Secondary | ICD-10-CM

## 2022-06-21 DIAGNOSIS — Z136 Encounter for screening for cardiovascular disorders: Secondary | ICD-10-CM

## 2022-06-21 DIAGNOSIS — Z9889 Other specified postprocedural states: Secondary | ICD-10-CM

## 2022-06-21 NOTE — Patient Instructions
Your instructions today:      -Medications:  Continue taking your current  medications, no changes.     - Labs: Check lab today or this week.     - Chest xray.     - Echocardiogram     -Regadenoson (chemical) stress test with imaging.     - Next follow up appointment: Dr. Geronimo Boot on 10/25/22.    - Call for any worsening symptoms of shortness of breath, swelling, sudden weight gain, lightheadedness, heart racing or chest pain.    - Weigh yourself  daily and call for weight increase greater than 3 pounds overnight or 5 pounds in one week.    - Check your blood pressure daily and bring your readings in to each visit for [place] to review. Sit and relax for 10 minute before checking your blood pressure.  Check your blood pressure 1-2 hours after you take you medicine(s).     -Contact our office if you are often having systolic readings (top number) numbers above 140/90 OR below 100/60.    - Follow a low sodium dietary restriction- no more than 2000 mg daily.      -  Limit fluids to 2 liters (64 ounces ) a day.      My Nursing team's voice mail: [phone]  Fax: [phone]   Scheduling: [phone] (For appointments 6 months or further out, call approximately 4 months before the appointment is due for best availability).     NOTE: MyChart messages and phone calls received after 4 pm on weekdays, on weekends or on holidays will NOT be seen until the following business day.  If you have an urgent matter during these times, please call the main switchboard at [phone] to reach the on-call team.     Your Heart Failure Symptom Awareness and Action Plan  Every Day Action Plan  Weigh yourself in the morning before breakfast. Write it down and compare it to yesterday's weight.  Take your medicine, as prescribed. Please call if you have concerns about the side effects, cost or refills.  Check for worsened swelling in your feet, ankles and stomach  Follow a 2000 mg salt restricted diet.  Limit fluids to 2 liters (64 ounces) a day.   Keep all healthcare appointments     Green Zone   Good! Symptoms are under control No shortness of breath  No increase in ankle swelling  No weight gain  No chest pain  No change in your usual activity  Continue to follow your everyday action plan.    Yellow Zone  If you have any of these symptoms, please call the heart failure nurses: [phone] Increased shortness of breath with activity  Weight gain of 3 pounds in one day or 5 pounds in a week  Increased swelling in your ankles or legs  Increased swelling in your stomach  Increasing fatigue  You may need an adjustment of your medications.    Red Zone  These are urgent symptoms. Please call the heart failure nurses: [phone] Shortness of breath at rest or waking up at night feeling short of breath or coughing  Increased number of pillows used or needing to sit upright to sleep  Chest tightness at rest  Dizziness, lightheadedness or feeling faint You need to schedule an appointment   Emergency Zone - call 911 Worsening chest tightness or pain that is not relieved by medication  Severe shortness of breath and a cough with pink, frothy sputum  [facility]    Nuclear Stress Test Instructions    Your cardiologist has asked that you have a nuclear stress test (also known as a Myocardial Perfusion Imaging (MPI) test.    This evaluation of your heart muscle consists of two sets of nuclear images and either a Treadmill stress test or a chemical stress test, decided by you and your physician.    You will get an IV placed in your arm for the test.    You will need to be able to raise your arm up by your head for about 20 minutes and lie on your back for about 10 minutes.  Please discuss this with your doctor or talk with the nuclear technologist or nurse if these are a problem for you.    It is recommended not to schedule any other appointment for the same day.      Wear comfortable clothing and walking shoes if you are walking on the treadmill.  Women should wear shorts or comfortable pants instead of dresses.   Sweatshirts or T-shirts work really well for imaging.    If you have a Zio Patch cardiac monitor in place, please reschedule your nuclear stress test after your monitoring period is complete. If you would like to proceed with the nuclear stress test during the monitoring time, the patch will have to be removed and the data will be lost; we can place a new patch following testing.       If you have a cardiac monitor with replacement patches, please inform the nurse prior to your appointment.  The monitor will need to be removed during testing. Please bring replacement patches with you so that we can resume monitoring following your test.     There may be enough time to leave to get a snack after the stress portion of the test.   The Technologist will tell you what time to return for the second set of images.    PLEASE NO CAFFEINE 24 HOURS PRIOR TO TEST:  Examples include coffee, tea, decaffeinated drinks, colas, [facility], Dr. Reino Kent.  Some orange sodas and root beers have caffeine, please check.  No energy drinks, Excedrin, Midol, or any foods CONTAINING CHOCOLATE.   Consuming Caffeine may postpone your test.    PLEASE DO NOT EAT OR DRINK THE MORNING OF YOUR TEST.  Water is ok to drink with your morning medications.    Please hold these medications the day of test:      DIABETIC PATIENTS:  if insulin dependent:  please take one third of your insulin with two pieces of dry toast and a small juice.  Bring remaining 2/3   insulin and oral diabetic medications with you to your test.    PLEASE NO TOBACCO PRODUCTS BETWEEN SCANS  You will NOT need a driver for this test.  But are welcome to bring a visitor with you.   Visitors will not be able to accompany you back to stress room.   Please do not bring children to the nuclear stress test.    TEST FINDINGS:   You will receive the results of the test within 7 business days of completion of this test by telephone. If you have any questions concerning your nuclear stress test or if you do not hear from your cardiologist/or nurse with 7 business days, please call our office.

## 2022-06-21 NOTE — Progress Notes
Date of Service: 06/21/2022    Alison Fuller is a 82 y.o. female.       HPI     Alison Fuller was seen in our office today for reevaluation of shortness of breath.  She is a 82 year old female followed in our office by Dr. Larwance Sachs. The patient has a medical history significant for moderate mitral valve regurgitation, s/p mitral valve clip implantation diastolic dysfunction/chronic diastolic heart failure/HFpEF, coronary artery disease s/p PCI/stent to the left main 06/2007, last heart cath 03/2008 showed widely patent stent in the LM-LAD, CKD, hypertension, hyperlipidemia.     Her last myocardial perfusion imaging stress test performed on 05/08/2016 interpreted as probably normal, findings consistent with soft tissue attenuation with no definite ischemic changeseen.     Her latest echo performed in June 2010 showed a 10 mm mean gradient across the mitral valve clip, there was severe regurgitation present, I am not sure how accurate that really is by Doppler.  Her aortic valve at the time of her last echo showed only trace regurgitation.  Her pulmonary artery systolic pressure was 55 mmHg and a gradient back in December 2022 was 9 mmHg with a pulmonary systolic pressure of 37.Her right ventricular systolic function was normal, the right atrial size was normal, her IVC was normal.  She had grade 2 diastolic dysfunction and elevated left atrial pressure.     She was last seen in our office on 04/17/22 by Dr. Geronimo Boot.  He reviewed the results of her most recent echocardiogram done in June 2022 which showed severe mitral valve regurgitation with a 10 mm mean gradient across the mitral valve clip.  Her aortic valve at the time of her last echo showed only a trace of regurgitation.  An elevated pulmonary artery systolic pressure of 55 mmHg.  He discussed treatment options with the patient however she did not want to proceed with any intervention at that time as she is the only caregiver for her invalid daughter who has multiple sclerosis with cellulitis.  He did ask the patient to have a B-natriuretic peptide checked with her next blood draw.  Recommended conservative treatment in the short run with plan to see patient back in spring 2024.    Today the patient presents with complaints of ncreasing shortness of breath, mostly on exertion and decline in exercise tolerancei.  She notes some progressive shortness of air with activity.  She is not short of breath at rest.  She complains of fatigue and feeling worn out.  She tells me she gets short of breath if she carries something heavy while walking going up a flight of stairs.  She tells me that stairs really bother her.  She does report having a slight productive cough a week ago but denies cough this week, she denies fever, chills, sweats and wheezing.  She denies palpitations, chest pain, exertional chest pain, dizziness, lightheadedness near-syncope and syncope.  She denies lower extremity edema and bloating.  Her appetite is good and denies early satiety.  She denies TIA or strokelike symptoms.  She tells me she weighs herself daily with home scale weights stable at 101 to 102 pounds, this correlates with a weight in our office today of 101 pounds which is stable from her last office visit in December.She is quite a bit stressed at home with caring for her daughter who has multiple sclerosis and congestive heart failure.  Patient lives next-door to her daughter and her daughter can function somewhat  independently but needs help with dressing and handling her medications, etc.  I suspect the patient has some caregiver fatigue.  I did discuss with the patient if she would like to have one of our social workers or nurse case managers contact her to see if there is any assistance available to help with her daughter in the home but she declines at this time stating that she thinks she is handling it okay.  Her daughter did have a home health nurse recently but was discharged from home health services last week.  Patient states she adheres to a strict low-sodium diet and is compliant taking her medications.  She has not had any laboratories checked recently.  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  Her blood pressure today is 130/62, pulse 72 bpm.    CARDIOVASCULAR STUDIES  Echocardiogram 10/15/21:  ? The left ventricular systolic function is normal. The visually estimated ejection fraction is 55%. There are no segmental wall motion abnormalities.  ? Grade II (moderate) left ventricular diastolic dysfunction. Elevated left atrial pressure.  ? The right ventricular size, wall thickness and systolic function are normal.  ? Mitral valve changes compatible with s/p MitraClip procedure, increased MG= 10 mmHg, HR = 65 bpm, severe regurgitation regurgitation.  ? No other significant valvular abnormalities.  ? Moderate pulmonary hypertension, estimated PAP = 55 mmHg     Compared to the previous study dated 04/20/2021: LVEF = 65%, mitral clip was present, increased MG = 9 mmHg, moderate MR, estimated PAP 37 mmHg.    TEE 01/06/2018  Conclusion  ? Successful deployment of single NT MitraClip  ? Reduction of mitral regurgitation from moderate to severe to mild/moderate  ? Mean mitral inflow gradient (average of two orifices) of 4.43mmHg at completion of case  ? No change in trivial pericardial effusion      Regadenoson Thallium MPI Stress Test 05/08/2016:  SUMMARY/OPINION:  This study is probably normal.  There are findings consistent with soft tissue attenuation, a definite ischemic change is not appreciated.  Left ventricular systolic function is normal, the calculated ejection fraction was 86%, which may be somewhat overestimated due to the small size left ventricle.. There are no high risk prognostic indicators present.  The pulmonary to myocardial count ratio 0.33.  There was no transient ischemic dilatation.       The study was compared to a myocardial perfusion study obtained in our office on 02/05/2010.  That study showed an ejection fraction of 70%, the end-diastolic volume is 47 mm, the pulmonary to myocardial count ratio is 0.30, there is no transient ischemic dilatation.  Prior study also showed some mild soft tissue changes, but overall there is no ischemia.     In comparing the two studies qualitatively the perfusion pattern appears quite similar suggesting no significant interval change.    Right and Left Cardiac Catheterization 07/03/2011:  LEFT HEART CATHETERIZATION:  LEFT VENTRICULAR CINEANGIOGRAM:  Left ventricular systolic function is estimated at 65 percent.  Angiographically, there was severe 4+ mitral regurgitation with visualization of the pulmonary vasculature as well as the left atrial appendage.  Left ventricular end-diastolic pressures were normal at 12 mmHg.  There was no gradient across aortic valve.     RIGHT HEART CATHETERIZATION:    1. Blood pressure was 162/92 with a mean arterial pressure of 104.  2. The heart rate was 75.  3. The right  atrial pressures were 10-12 mmHg.  4. The right ventricular pressures were 48/12 mmHg.  5. The pulmonary artery pressures were 45/22 with a mean pulmonary artery pressure of 29 mmHg.  6. The pulmonary artery occlusive pressure was 18 mmHg.  7. The A-VO2 difference was 4.9 with an estimated Fick of 3.9 L/min.  8. The cardiac output by thermodilution was 3.6 L/min with an index of 2.3 L/min per sq m.   9. The aortic sat was 98 percent.  10. The right atrial sat was 73 percent.  11. The pulmonary artery sat was 71 percent.     ASSESSMENT:    1. Severe mitral regurgitation with elevated pulmonary artery pressures.    2. Widely patent distal left main stent extending into the LAD with no angiographically significant atherosclerosis noted otherwise.   3. Normal left ventricular systolic function.  EF 65 percent with an end-diastolic pressure of 12 mmHg. Assessment:   1.  Shortness of breath.-The patient notes dyspnea on exertion with recent progressive shortness of air.  She also having a decline in her activity tolerance over the past 4 to 6 weeks as her last visit.  She does have known severe mitral valve regurgitation following MitraClip.    2.  Chronic diastolic heart failure/HFpEF.  - EF 55% by echo 09/2021.    -She describes NYHA class 3.   - BNP 146 when checked on 04/30/2022.  - Blood pressure is normotensive.  - Patient's weight is stable and at her baseline weight.  - She appears euvolemic on examination today.  - Patient is currently not on a loop diuretic.  - Not on spironolactone due to CKD  -She does take hydrochlorothiazide.    3.  Mitral valve regurgitation and mitral stenosis . s/p MitraClip x 1 in September 2019.  She has severe mitral valve regurgitation despite MitraClip.  -Latest echo performed on 10/15/2021 with normal LV systolic function and severe MR and pulmonary hypertension.  -Her gradient is somewhere around 9-10 mmHg.  Her pulmonary artery pressures have increased, her right heart size and function remain normal.  She has elevated left atrial pressure.  -At her last visit with Dr. Geronimo Boot on 04/17/2022 they discussed options for treatment and she did not want to make any interventions at that time because she is the only caregiver for her invalid daughter.  -It sounds like she is having progressive symptoms of MR.    4. Coronary artery disease  -s/p PCI with stent to LM and stent to LAD 06/2007  -Last ischemic workup with regadenoson MPI stress test 04/2016 without definite ischemic change, there were findings consistent with soft tissue attenuation.      5..  Hypertension.  Controlled.    6.  Hyperlipidemia.  -Treated with pravastatin 40 mg daily.  - Last lipid profile in our record dated 04/10/2021: Total cholesterol 166, triglycerides 58, HDL 63  -Patient had muscle pain with  atorvastatin.    7. CKD.    -Last creatinine 1.23 04/10/2021  -Baseline creatinine 1.2  -Patient reports having laboratories through her PCP sometime after the first of the year when she had her annual wellness exam.  I have asked my nurse to obtain those results.    Plan:  -Continue current medications, no changes.  - Check 2D echo Doppler  - Regadenoson MPI stress test  - Chest x-ray PA and lateral today  -Check  BMP , BNP today.   -Recommend referral to our valve clinic for further evaluation of her  mitral valve regurgitation.  Patient may need heart cath.  Patient declines going to [facility] at this time.  -Going to consult with Dr.Rosamond additional recommendations.  -Check daily weights, blood pressure and pulse and keep diary to bring to each visit.  - Instructed to call our office for weight gain of 3 pounds in 1 day or 5 pounds in 1 week.  - Instructed to call office if her blood pressures consistently above 135/85 or below 100/60.  -Discussed importance of maintaining a 2 g sodium restricted/low salt diet.  - 2 L fluid restriction.  -Reviewed and discussed flag warning signs of worsening mitral valve disease including increasing dyspnea exertion, fatigue, decline in tibia tolerance, dizziness, palpitations and when to go to the emergency department.    Follow-up: Dr. Geronimo Boot as scheduled on 10/25/22.  Patient encouraged to contact our office if she has problems prior to next visit.    I have educated patien/family on the plan of care today. Patient/family verbally expressed understanding and agreement with the plan. Instructions are outlined in the after visit summary document.      Thank you for the opportunity to participate in the care of this individual. Please do not hesitate to contact us if you have further questions or concerns.     Total time spent on today's office visit was  45  minutes.  This includes face-to-face in person visit with patient as well as nonface-to-face time including review of the EMR, review of outside facility  records, labs, radiologic studies, echocardiogram & other cardiovascular studies, physical exam, assessment, formation of treatment plan, after visit summary, follow up plan and on documentation I reviewed and discussed her heart disease, medication instructions, medication interactions, treatment options/risk/benefits, reviewed and discussed lab and cardiac test results, daily weight monitoring,blood pressure/pulse monitoring, target blood pressure, target pulse rate, sodium restriction, fluid restriction, understanding of HF symptoms and s/s of fluid overload, reviewed HF zones, follow-up plan.  All questions were answered to the patient's satisfaction.                   DRB  Vitals:    06/21/22 0953   BP: 130/62   BP Source: Arm, Left Upper   Pulse: 72   SpO2: 98%   O2 Device: None (Room air)   PainSc: Zero   Weight: 45.9 kg (101 lb 3.2 oz)   Height: 154.9 cm (5' 1)     Body mass index is 19.12 kg/m?.     Current Medications (including today's revisions)   acetaminophen (TYLENOL) 325 mg tablet Take two tablets by mouth every 6 hours as needed.    aspirin EC 81 mg PO tablet Take 1 Tab by mouth Daily. (Patient taking differently: Take one tablet by mouth at bedtime daily.)    Calcium-Magnesium-Zinc 333-133-5 mg tab Take 1 tablet by mouth daily.    Cholecalciferol (Vitamin D3) 50 mcg (2,000 unit) capsule Take one capsule by mouth daily.    coenzyme Q10 (CO Q-10) 100 mg capsule Take one capsule by mouth daily.    fish oil- omega 3-DHA/EPA 300/1,000 mg capsule Take one capsule by mouth daily.    hydroCHLOROthiazide (HYDRODIURIL) 25 mg tablet Take one tablet by mouth daily.    lisinopriL (ZESTRIL) 40 mg tablet Take one tablet by mouth daily.    nitroglycerin (NITROSTAT) 0.4 mg tablet Place one tablet under tongue as Needed for Chest Pain.    pantoprazole DR (PROTONIX) 40 mg tablet  pravastatin (PRAVACHOL) 40 mg tablet Take one tablet by mouth at bedtime daily.    vitamins, multiple tablet Take one tablet by mouth daily.                   Review of Systems   Constitutional: Negative.   HENT: Negative.     Eyes: Negative.    Cardiovascular: Negative.    Respiratory:  Positive for shortness of breath.    Endocrine: Negative.    Hematologic/Lymphatic: Negative.    Skin: Negative.    Musculoskeletal: Negative.    Gastrointestinal: Negative.    Genitourinary: Negative.    Neurological: Negative.    Psychiatric/Behavioral: Negative.     Allergic/Immunologic: Negative.        Physical Exam  Vital signs were reviewed.   General Appearance: appears well nourished, appears relaxed, patient looks very put together with her hair and make-up done nicely, in no acute distress,BMI 19.1  Skin: warm, moist, intact, no rash or lesions, no xanthomas  HEENT: unremarkable, pupils equal and round, no scleral icterus, conjunctivae and lids normal  Lips & Mouth: no pallor or cyanosis  Neck Veins: JVP normal, JVP not elevated above the sternal notch, neck veins are flat, neck veins are not distended   Carotid Arteries: normal carotid upstroke bilaterally, no bruits bilaterally  Chest Inspection: chest is normal in appearance  Auscultation/Percussion/Effort: lungs clear to auscultation, no rales, rhonchi, or wheezing, respirations even and unlabored, no respiratory distress  Cardiac Rhythm: regular rhythm and normal rate   Cardiac Auscultation: normal S1 & S2, no S3 or S4, no rub   Murmurs: grade 1/6 systolic murmur, could not detect a diastolic murmur  Extremities: no lower extremity edema bilaterally, 2+ symmetric distal pulses   Abdominal Exam: soft, non-tender, non-distended, no obvious masses, bowel sounds normal, no guarding, no abdominal bruits  Liver & Spleen: no organomegaly   Neurologic Exam: grossly intact, alert, moves all extremities equally   Orientation: oriented to time, person and place, clear historian  Gait: normal, steady, walks without assistance  Language & Memory: speech clear, patient responsive, seems to comprehend information          Cardiovascular Health Factors  Vitals BP Readings from Last 3 Encounters:   06/21/22 130/62   04/17/22 128/60   10/15/21 (!) 162/80     Wt Readings from Last 3 Encounters:   06/21/22 45.9 kg (101 lb 3.2 oz)   04/17/22 45.7 kg (100 lb 12.8 oz)   10/15/21 47 kg (103 lb 9.6 oz)     BMI Readings from Last 3 Encounters:   06/21/22 19.12 kg/m?   04/17/22 19.05 kg/m?   10/15/21 19.58 kg/m?      Smoking Social History     Tobacco Use   Smoking Status Never   Smokeless Tobacco Never      Lipid Profile Cholesterol   Date Value Ref Range Status   04/10/2021 166  Final     HDL   Date Value Ref Range Status   04/10/2021 63  Final     LDL   Date Value Ref Range Status   04/10/2021 92  Final     Triglycerides   Date Value Ref Range Status   04/10/2021 58  Final      Blood Sugar Hemoglobin A1C   Date Value Ref Range Status   01/05/2018 5.2 4.0 - 6.0 % Final     Comment:     The ADA recommends that most patients with type 1  and type 2 diabetes maintain   an A1c level <7%.       Glucose   Date Value Ref Range Status   04/10/2021 87  Final   03/28/2020 93  Final   10/21/2019 92  Final   03/29/2015 94 70 - 100 mg/dL Final     Glucose, POC   Date Value Ref Range Status   01/06/2018 109 (H) 70 - 100 MG/DL Final   16/01/9603 540 (H) 70 - 100 MG/DL Final   98/02/9146 829 (H) 70 - 100 MG/DL Final        Problems Addressed Today  Encounter Diagnoses   Name Primary?    DOE (dyspnea on exertion) Yes    Mixed hyperlipidemia     Hypertension, unspecified type     Coronary artery disease involving native coronary artery of native heart without angina pectoris     S/P mitral valve clip implantation     Screening for heart disease     Chronic diastolic heart failure (HCC)     Nonrheumatic mitral valve regurgitation        Past Medical History  Patient Active Problem List    Diagnosis Date Noted    S/P mitral valve clip implantation 02/10/2018    Chronic diastolic heart failure (HCC) 02/10/2018    Atypical chest pain 02/21/2016    Leg swelling 09/24/2011    DOE (dyspnea on exertion) 07/03/2011    Chest pain 07/03/2011    Nonrheumatic mitral valve regurgitation 07/03/2011    Lightheadedness      A. 02/28/09 EP consultation for PVC's with reported lightheadedness.  B. 03/06/09 Positive head up tilt table.Tilt-table test is considered positive for syncope with nitroglycerin challenge,       but not in the unchallenged 30-minute portion.      CKD (chronic kidney disease) 04/25/2008    CAD (coronary artery disease) 07/07/2007     a.   03/09 referred by Dr. Richarda Blade for angina.  Exe echo ositive with 3 mm ST depression and ischemia in the anterior and lateral wall.  b.   03/09 angiogram- Left main 70% with ostial LAD 80%.  LCX diminutive vessel.  RCA large dominant prox 20% EF 70%.  Aorta normal.   PCI of LAD with 3.0 Xience stent and PCI of left main with 3.5 Xience stent and IVUS.  d.   06/09 angiogram for followup left main stenting.  Left main and LAD: No disease or restenosis.  LCX and RCA no disease.  EF 55%  e.   12/09 - Cath: widely patent stent in LM-LAD. No other significant stenosis. Normal LV function.  f.    07/10  Ex Echo normal echo.  Negative for ischemia, fair ex tolerance without chest pain  g.  02/05/10 - Stress MPI - 70%EF appears to be a normal study      Hyperlipidemia 07/07/2007     a.   03/09 total cholesterol 222, triglycerides 66, HDL 74, LDL 135. Zocor started.  b.   05/25/08 TC 159 TG 43 HDL 68 LDL 82 - zocor increased to 80mg  daily.   c.   11/23/08  T chol 160, TG 74, HDL 54, LDL 83  C/o gi intolerance ot zocor.  Changed to crestor 20mg  daily and CoQ 10 100mg       HTN (hypertension)

## 2022-06-25 ENCOUNTER — Encounter: Admit: 2022-06-25 | Discharge: 2022-06-25 | Payer: MEDICARE | Primary: Primary Care

## 2022-06-25 NOTE — Telephone Encounter
Spoke to patient regarding chest x-ray and lab work. Patient states [facility] and she is going to get her lab work today. Will request imaging report from [facility].

## 2022-06-25 NOTE — Telephone Encounter
----- Message from Ginger E McIntosh-James, APRN-NP sent at 06/24/2022  5:53 PM CST -----  Adelina Mings--  I saw this lady in the office on Friday, 06/21/2022 if this is the 1 with severe MR despite MitraClip.  She follows with TLR he saw her in December.  I did send the below message to him and I did follow-up with him by phone today. .Let the patient know that he agrees with my plan.  She needs a regadenoson MPI stress test which is scheduled on 3/8 and the echo which is scheduled on 3/13.  He agrees for her to have a chest x-ray, BMP and BNP.  I do not see that those are done yet.  I was hoping she would have gotten her chest x-ray on Friday when I saw her in the office but she was hesitant to go that day.  She still needs to get her labs checked.  Once we have all her test results back I will review everything with Dr. Geronimo Boot for next steps.  I know patient is very reluctant to come to [facility] because she cares for her invalid adult daughter who has MS and CHF.  Thanks.  GM  ----- Message -----  From: Morton Stall, APRN-NP  Sent: 06/24/2022   8:11 AM CST  To: Glade Stanford, RN    Vernona Rieger,   Thanks for letting me know.  I am just trying to find the quickest way to get to TLR.  Do know if he is working today or this week?  I will text him if he is around.    Thanks.  Ginger  ----- Message -----  From: Glade Stanford, RN  Sent: 06/24/2022   7:37 AM CST  To: Ginger E McIntosh-James, APRN-NP    Good morning Ginger.   It would be more beneficial if you could page or text Dr. Geronimo Boot with these patient issues. The [employer]'s work remote 50% and [facility] team does not follow TLR to outlying locations.     Thanks!   ----- Message -----  From: Morton Stall, APRN-NP  Sent: 06/21/2022   7:39 PM CST  To: Laurence Aly, MD; #    TLR--I saw your patient in the [facility] office today for reevaluation with complaints of increasing shortness of breath, decreased activity tolerance and fatigue.  It sounds like her symptoms of MR getting worse.  She does have severe mitral valve regurgitation despite having MitraClip.  Her last echo performed on 10/15/2021 shows severe MR with increased mean gradient of 10 mmHg at a heart rate of 65 bpm and moderate pulmonary hypertension with PASP 55 mmHg.  You did talk to her about options for treating her MR her last visit with you on 04/17/2022 but patient wanted to continue with conservative options as she is caring for her invalid daughter who has MS and CHF.  Patient does not feel like she can come him to the hospital for procedures that would take her away from caring for her daughter.  I did order an updated echocardiogram, regadenoson MPI stress test as she does have underlying CAD and has not had an ischemic workup in quite some time, chest x-ray PA and lateral and lab including a BMP and BNP.  I do think that she should have a referral to the valve clinic for further evaluation of her MR if you agree.  However, patient declines going to [facility] for appointments at this time stating that  she cannot leave her daughter.  She does not feel like she would be able to have any type of surgery or procedure down at [facility].  Told me that if she requires surgery or procedure this may need to be done at [facility] in [place].    On examination today appeared euvolemic on examination.  She is not on a loop diuretic.  She cannot take spironolactone due to CKD.  She is normotensive.  Her weight is stable.  She is not having lower extremity edema or bloating.  Her lungs are clear.    Please send me and the [place] [facility] office your recommendations.  Thanks.  GM      Team [facility]LR nurses-please alert TLR on Monday of this message in his inbox and asked him to get back with me and the [facility] nurses.  Thanks.  GM

## 2022-06-27 ENCOUNTER — Encounter: Admit: 2022-06-27 | Discharge: 2022-06-27 | Payer: MEDICARE | Primary: Primary Care

## 2022-06-27 DIAGNOSIS — I340 Nonrheumatic mitral (valve) insufficiency: Secondary | ICD-10-CM

## 2022-06-27 DIAGNOSIS — Z9889 Other specified postprocedural states: Secondary | ICD-10-CM

## 2022-06-27 DIAGNOSIS — I2510 Atherosclerotic heart disease of native coronary artery without angina pectoris: Secondary | ICD-10-CM

## 2022-06-27 NOTE — Telephone Encounter
Called patient to check symptoms after receiving recent lab results. Patient states that her symptoms are stable. She states that she is still short of breath but it is manageable. She denies any swelling and states that her weights have been stable. Patient states that she has not been able to lay flat in bed to sleep for the past couple of weeks and states that she has to "slow down" when it comes to daily chores. Will review with DRB since GM and TLR are on vacation.       Discussed with DRB. He recommends for patient to switch HCTZ to Lasix '20mg'$  daily with potassium '10mg'$  and for patient to be seen by Dr. Aris Georgia next available.

## 2022-07-02 ENCOUNTER — Encounter: Admit: 2022-07-02 | Discharge: 2022-07-02 | Payer: MEDICARE | Primary: Primary Care

## 2022-07-02 NOTE — Telephone Encounter
Pt called in and states that she has been talking to [place] [facility] about her current status and needed testing.  She states that she has been talking to her family and her grandson is willing to help her so that she can go to [place] for necessary testing.  She states that her and her family do not feel comfortable going outside of [place].  Scheduled pt with TLR to discuss plan of care.  Pt confirmed appt time and location.

## 2022-07-03 ENCOUNTER — Encounter: Admit: 2022-07-03 | Discharge: 2022-07-03 | Payer: MEDICARE | Primary: Primary Care

## 2022-07-03 DIAGNOSIS — I2510 Atherosclerotic heart disease of native coronary artery without angina pectoris: Secondary | ICD-10-CM

## 2022-07-03 DIAGNOSIS — R002 Palpitations: Secondary | ICD-10-CM

## 2022-07-03 DIAGNOSIS — Z8711 Personal history of peptic ulcer disease: Secondary | ICD-10-CM

## 2022-07-03 DIAGNOSIS — J302 Other seasonal allergic rhinitis: Secondary | ICD-10-CM

## 2022-07-03 DIAGNOSIS — M1990 Unspecified osteoarthritis, unspecified site: Secondary | ICD-10-CM

## 2022-07-03 DIAGNOSIS — I340 Nonrheumatic mitral (valve) insufficiency: Secondary | ICD-10-CM

## 2022-07-03 DIAGNOSIS — N189 Chronic kidney disease, unspecified: Secondary | ICD-10-CM

## 2022-07-03 DIAGNOSIS — R55 Syncope and collapse: Secondary | ICD-10-CM

## 2022-07-03 DIAGNOSIS — E782 Mixed hyperlipidemia: Secondary | ICD-10-CM

## 2022-07-03 DIAGNOSIS — I493 Ventricular premature depolarization: Secondary | ICD-10-CM

## 2022-07-03 DIAGNOSIS — I10 Essential (primary) hypertension: Secondary | ICD-10-CM

## 2022-07-03 DIAGNOSIS — E785 Hyperlipidemia, unspecified: Secondary | ICD-10-CM

## 2022-07-03 DIAGNOSIS — J40 Bronchitis, not specified as acute or chronic: Secondary | ICD-10-CM

## 2022-07-03 DIAGNOSIS — Z136 Encounter for screening for cardiovascular disorders: Secondary | ICD-10-CM

## 2022-07-03 DIAGNOSIS — R0609 Other forms of dyspnea: Secondary | ICD-10-CM

## 2022-07-03 NOTE — Patient Instructions
Thank you for visiting our office today.    We would like to make the following medication adjustments:  NONE       Otherwise continue the same medications as you have been doing.          We will be pursuing the following tests after your appointment today:       Orders Placed This Encounter    Referral to Valve Clinic    ECG 12-LEAD         Please call us in the meantime with any questions or concerns.        Please allow 5-7 business days for our providers to review your results. All normal results will go to MyChart. If you do not have Mychart, it is strongly recommended to get this so you can easily view all your results. If you do not have mychart, we will attempt to call you once with normal lab and testing results. If we cannot reach you by phone with normal results, we will send you a letter.  If you have not heard the results of your testing after one week please give [place] a call.       Your Cardiovascular Medicine [place] Team Richardson Landry, Rene Kocher, Threasa Beards, and [place])  phone number is [phone].

## 2022-07-03 NOTE — Telephone Encounter
Inst called by Merleen Nicely.

## 2022-07-05 ENCOUNTER — Ambulatory Visit: Admit: 2022-07-05 | Discharge: 2022-07-05 | Payer: MEDICARE | Primary: Primary Care

## 2022-07-05 ENCOUNTER — Encounter: Admit: 2022-07-05 | Discharge: 2022-07-05 | Payer: MEDICARE | Primary: Primary Care

## 2022-07-05 DIAGNOSIS — R0609 Other forms of dyspnea: Secondary | ICD-10-CM

## 2022-07-05 DIAGNOSIS — Z9889 Other specified postprocedural states: Secondary | ICD-10-CM

## 2022-07-05 DIAGNOSIS — I10 Essential (primary) hypertension: Secondary | ICD-10-CM

## 2022-07-05 DIAGNOSIS — I5032 Chronic diastolic (congestive) heart failure: Secondary | ICD-10-CM

## 2022-07-05 DIAGNOSIS — I340 Nonrheumatic mitral (valve) insufficiency: Secondary | ICD-10-CM

## 2022-07-05 DIAGNOSIS — E782 Mixed hyperlipidemia: Secondary | ICD-10-CM

## 2022-07-05 DIAGNOSIS — Z136 Encounter for screening for cardiovascular disorders: Secondary | ICD-10-CM

## 2022-07-05 DIAGNOSIS — I2510 Atherosclerotic heart disease of native coronary artery without angina pectoris: Secondary | ICD-10-CM

## 2022-07-05 MED ORDER — SODIUM CHLORIDE 0.9 % IV SOLP
250 mL | INTRAVENOUS | 0 refills | Status: AC | PRN
Start: 2022-07-05 — End: ?

## 2022-07-05 MED ORDER — REGADENOSON 0.4 MG/5 ML IV SYRG
.4 mg | Freq: Once | INTRAVENOUS | 0 refills | Status: CP
Start: 2022-07-05 — End: ?

## 2022-07-05 MED ORDER — EUCALYPTUS-MENTHOL MM LOZG
1 | Freq: Once | ORAL | 0 refills | Status: AC | PRN
Start: 2022-07-05 — End: ?

## 2022-07-05 MED ORDER — AMINOPHYLLINE 25 MG/ML IV SOLN
50 mg | INTRAVENOUS | 0 refills | Status: AC | PRN
Start: 2022-07-05 — End: ?

## 2022-07-05 MED ORDER — RP DX TC-99M TETROFOSMIN MCI
8 | Freq: Once | INTRAVENOUS | 0 refills | Status: CP
Start: 2022-07-05 — End: ?

## 2022-07-05 MED ORDER — ALBUTEROL SULFATE 90 MCG/ACTUATION IN HFAA
2 | RESPIRATORY_TRACT | 0 refills | Status: AC | PRN
Start: 2022-07-05 — End: ?

## 2022-07-05 MED ORDER — RP DX TC-99M TETROFOSMIN MCI
24 | Freq: Once | INTRAVENOUS | 0 refills | Status: AC
Start: 2022-07-05 — End: ?

## 2022-07-05 MED ORDER — NITROGLYCERIN 0.4 MG SL SUBL
.4 mg | SUBLINGUAL | 0 refills | Status: AC | PRN
Start: 2022-07-05 — End: ?

## 2022-07-08 ENCOUNTER — Encounter: Admit: 2022-07-08 | Discharge: 2022-07-08 | Payer: MEDICARE | Primary: Primary Care

## 2022-07-08 NOTE — Telephone Encounter
Reviewed results and recommendations with patient. Patient has no questions or concerns at this time.       Reached out to nurse navigator for the valve clinic. They have received the referral and are working to get the patient scheduled with [facility]W. Encouraged patient to make herself available when valve team reaches out.

## 2022-07-08 NOTE — Telephone Encounter
-----   Message from Watkins, APRN-NP sent at 07/08/2022  3:33 PM CDT -----  Nurses, Nurses-Please let the  the patient I reviewed the results of her regadenoson MPI stress test that was interpreted by Dr. Artis Delay.  Study is normal,  shows no myocardial ischemia and normal LV systolic function.  EKG portion of the study also negative for ischemia.  No change compared to the study dated 05/08/2016 that was also normal.    Good news and reassuring.     Patient is scheduled for her echo this week on 07/10/2022.     She hust saw TLR in [place] last week on 07/03/2022.  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  [facility] office visit note states he would like to have her see Dr. Peggyann Juba in the valve clinic as soon as possible to move forward with evaluating her other treatment options.  In the past "she is ruled out any further attempts, but I think at this point she is symptomatic enough that she would like some help with this."      Scheduled follow-up with TLR in [place] on 10/25/2022.Marland Kitchen     Please make sure she is  scheduled for  office visit with Dr. Lorriane Shire or one of the other providers in the valve clinic--see an appointment scheduled yet.      Thanks.GM

## 2022-07-10 ENCOUNTER — Encounter: Admit: 2022-07-10 | Discharge: 2022-07-10 | Payer: MEDICARE | Primary: Primary Care

## 2022-07-10 ENCOUNTER — Ambulatory Visit: Admit: 2022-07-10 | Discharge: 2022-07-10 | Payer: MEDICARE | Primary: Primary Care

## 2022-07-10 DIAGNOSIS — I2510 Atherosclerotic heart disease of native coronary artery without angina pectoris: Secondary | ICD-10-CM

## 2022-07-10 DIAGNOSIS — Z9889 Other specified postprocedural states: Secondary | ICD-10-CM

## 2022-07-10 DIAGNOSIS — I340 Nonrheumatic mitral (valve) insufficiency: Secondary | ICD-10-CM

## 2022-07-10 DIAGNOSIS — Z136 Encounter for screening for cardiovascular disorders: Secondary | ICD-10-CM

## 2022-07-10 DIAGNOSIS — I5032 Chronic diastolic (congestive) heart failure: Secondary | ICD-10-CM

## 2022-07-10 DIAGNOSIS — R0609 Other forms of dyspnea: Secondary | ICD-10-CM

## 2022-07-10 DIAGNOSIS — I10 Essential (primary) hypertension: Secondary | ICD-10-CM

## 2022-07-10 DIAGNOSIS — E782 Mixed hyperlipidemia: Secondary | ICD-10-CM

## 2022-07-10 NOTE — Progress Notes
Report Sheet    TEE           Indication: MR with previous robotic MVR and Mitralclip   Ordering Provider: Noreene Filbert     Name: Alison Fuller     Age: 82 y.o.    DOB: [birth year]     MRN: [id number]  Patient phone number: [phone]    Communication Barriers:    Lab(s) needed:   Other:     Device check needed: None   Device: No results found for: GENERATOR, EPDEVTYP    Other implanted devices/pumps:   Has Controller (pt to bring):     Pt Called: LVM to call us back.  No Mychart   Arrival Time: 0900 for 1000 appt   Driver Information:     Date of Last H&P:  07/03/22  Additional Information:     Prior TEE:      Prior DCCV:      Prior Echo:     09/2021 EF 55%  Previous TEE/DCCV details:     EF:   ECHO EF   Date Value Ref Range Status   10/15/2021 55 % Final       Anticoag:   ASA   Dose:     Frequency:     Missed:  Labs   INR    HGB    Platelets    Glucose    NA    K    Creatinine    Other      Medications to HOLD day of:  Vitamins/Supplements  HCTZ     Probe   Gastric Surgery    GERD    Swallow difficulty    Head/Neck Surg/Rad    Chest Surg/Rad    EGD    Varices/Esophag CA    GI bleed    Dental issues      Sedation   COPD    OSA/CPAP    Asthma    Pulm HTN    Anesthesia Issues    Smoker    ETOH & Frequency    Drug Use    Chronic Pain Med      Current Medications:    acetaminophen (TYLENOL) 325 mg tablet Take two tablets by mouth every 6 hours as needed.    aspirin EC 81 mg PO tablet Take 1 Tab by mouth Daily. (Patient taking differently: Take one tablet by mouth at bedtime daily.)    Calcium-Magnesium-Zinc 333-133-5 mg tab Take 1 tablet by mouth daily.    Cholecalciferol (Vitamin D3) 50 mcg (2,000 unit) capsule Take one capsule by mouth daily.    fish oil- omega 3-DHA/EPA 300/1,000 mg capsule Take one capsule by mouth daily.    hydroCHLOROthiazide (HYDRODIURIL) 25 mg tablet Take one tablet by mouth daily.    lisinopriL (ZESTRIL) 40 mg tablet Take one tablet by mouth daily.    nitroglycerin (NITROSTAT) 0.4 mg tablet Place one tablet under tongue as Needed for Chest Pain.    pantoprazole DR (PROTONIX) 40 mg tablet Take one tablet by mouth daily.    pravastatin (PRAVACHOL) 40 mg tablet Take one tablet by mouth at bedtime daily.    vitamins, multiple tablet Take one tablet by mouth daily.         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    Chronic diastolic heart failure (HCC) 02/10/2018    Atypical chest pain 02/21/2016    Leg swelling  09/24/2011    DOE (dyspnea on exertion) 07/03/2011    Chest pain 07/03/2011    Nonrheumatic mitral valve regurgitation 07/03/2011    Lightheadedness     CKD (chronic kidney disease) 04/25/2008    CAD (coronary artery disease) 07/07/2007    Hyperlipidemia 07/07/2007    HTN (hypertension)       Past Medical History:   Diagnosis Date    Arthritis     Bronchitis     CAD (coronary artery disease) 07/07/2007    CKD (chronic kidney disease) 04/25/2008    CRI (chronic renal insufficiency) 04/25/2008    Heart palpitations 02/10/2009    History of gastric ulcer     HTN (hypertension)     Hyperlipidemia 07/07/2007    Mitral valve regurgitation 07/03/2011    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 Previous Holter study showed 10-15% PVC burden.    Seasonal allergies     Syncope 04/04/2009      Surgical History:   Procedure Laterality Date    HX CORONARY STENT PLACEMENT  06/2007    L main and LAD    MITRAL VALVE SURGERY  2013    Robotic asisted valvuloplasty and annuloplasty    PERCUTANEOUS TRANSCATHETER REPAIR MITRAL VALVE N/A 01/06/2018    Performed by Lynne Logan, MD at [facility] CV[facility]    CHOLECYSTECTOMY      HX TUBAL LIGATION         Allergies:   Allergies   Allergen Reactions    Doxycycline BLISTERS and EDEMA     Lip Swelling    Lipitor [Atorvastatin] MUSCLE PAIN    Sulfadiazine EDEMA     Sulfa drugs     Jillyn Ledger, RN

## 2022-07-10 NOTE — Telephone Encounter
Patient returned call about TEE procedure instructions.    Went over instructions with patient and confirmed that will need to check in at main hospital at 0900 am on Monday 3/18 for TEE at 1000 am  NPO at midnight and may take morning medications with a few sips of water. Hold hydrochlorothiazide and supplements the morning of.   No lotions/oils to the chest area and comfortable clothing.   Reiterated will need a driver with her since getting sedation.     Please call [phone] if you have any other questions/concerns.    Thank you,    Steffanie Dunn RN

## 2022-07-11 ENCOUNTER — Encounter: Admit: 2022-07-11 | Discharge: 2022-07-11 | Payer: MEDICARE | Primary: Primary Care

## 2022-07-11 NOTE — Telephone Encounter
Left voicemail with results and recommendations. Left call back number for questions, comments, or concerns.

## 2022-07-11 NOTE — Telephone Encounter
-----   Message from Lou Cal, RN sent at 07/11/2022  3:52 PM CDT -----    ----- Message -----  From: Morton Stall, APRN-NP  Sent: 07/11/2022  11:33 AM CDT  To: Cvm Nurse [place]    Nurses,Please let the patient know that  I reviewed patient's echo done yesterday 3/13.  She just saw TLR in the office on 07/03/2022 to follow-up on her mitral clip and heart failure.  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  Patient is symptomatic with limited exercise capacity and dyspnea on exertion.    Her current echo shows normal LV systolic function, EF 55%.  Diastolic function could not be determined given prior mitral valve intervention.  No wall motion abnormality.  MitraClip x 1 with severe residual MR with mean gradient 6-8 mmHg and a ventricular rate of 83 with trace of tricuspid regurgitation.  PA systolic pressure could not be estimated.    Compared to the June 2023 study LV function remains preserved.  This redemonstrates patient's previously placed mitral clip and residual severe MR.  Mean gradient across the mitral clip are about the same.  Her pulmonary pressures have been increasing.    Her echo from June of 2023  with 55% ejection fraction.  She had no segmental abnormalities.  Her right-sided ventricle was normal.  She had a mitral valve clip, but her gradient was up to 10 mmHg at a heart rate of 65 beats per minute and there was severe mitral valve regurgitation.  Pulmonary artery systolic pressure at that time was 55 mmHg and prior to that time in December 2022, her gradient was 9 mmHg.  Pulmonary artery systolic pressure is 37.     TLR recommends that she follow-up with Dr. Paris Lore in the valve clinic as soon as possible to move forward with evaluating her further treatment options for her valvular disease.       Please discuss with patient that she continues to have severe residual mitral valve regurgitation and that Dr. Geronimo Boot advises she see Dr. Paris Lore in our valve team to do further evaluation and recommend treatment options for her.    Her MPI stress test performed on 07/05/2022 was normal with no evidence of ischemia and normal LV systolic function.    Looks like she is  scheduled to see Dr. Paris Lore with TEE on 07/15/2022.     Thanks. GM

## 2022-07-14 ENCOUNTER — Encounter: Admit: 2022-07-14 | Discharge: 2022-07-14 | Payer: MEDICARE | Primary: Primary Care

## 2022-07-15 ENCOUNTER — Encounter: Admit: 2022-07-15 | Discharge: 2022-07-15 | Payer: MEDICARE | Primary: Primary Care

## 2022-07-15 ENCOUNTER — Ambulatory Visit: Admit: 2022-07-15 | Discharge: 2022-07-15 | Payer: MEDICARE | Primary: Primary Care

## 2022-07-15 DIAGNOSIS — M1990 Unspecified osteoarthritis, unspecified site: Secondary | ICD-10-CM

## 2022-07-15 DIAGNOSIS — R002 Palpitations: Secondary | ICD-10-CM

## 2022-07-15 DIAGNOSIS — I340 Nonrheumatic mitral (valve) insufficiency: Secondary | ICD-10-CM

## 2022-07-15 DIAGNOSIS — J302 Other seasonal allergic rhinitis: Secondary | ICD-10-CM

## 2022-07-15 DIAGNOSIS — I10 Essential (primary) hypertension: Secondary | ICD-10-CM

## 2022-07-15 DIAGNOSIS — I2510 Atherosclerotic heart disease of native coronary artery without angina pectoris: Secondary | ICD-10-CM

## 2022-07-15 DIAGNOSIS — I493 Ventricular premature depolarization: Secondary | ICD-10-CM

## 2022-07-15 DIAGNOSIS — E785 Hyperlipidemia, unspecified: Secondary | ICD-10-CM

## 2022-07-15 DIAGNOSIS — N189 Chronic kidney disease, unspecified: Secondary | ICD-10-CM

## 2022-07-15 DIAGNOSIS — Z8711 Personal history of peptic ulcer disease: Secondary | ICD-10-CM

## 2022-07-15 DIAGNOSIS — J40 Bronchitis, not specified as acute or chronic: Secondary | ICD-10-CM

## 2022-07-15 DIAGNOSIS — Z9889 Other specified postprocedural states: Secondary | ICD-10-CM

## 2022-07-15 DIAGNOSIS — R55 Syncope and collapse: Secondary | ICD-10-CM

## 2022-07-15 DIAGNOSIS — I5032 Chronic diastolic (congestive) heart failure: Secondary | ICD-10-CM

## 2022-07-15 MED ORDER — SODIUM CHLORIDE 0.9 % IJ SOLN
50 mL | Freq: Once | INTRAVENOUS | 0 refills | Status: CP
Start: 2022-07-15 — End: ?
  Administered 2022-07-15: 20:00:00 50 mL via INTRAVENOUS

## 2022-07-15 MED ORDER — PROPOFOL 10 MG/ML IV EMUL 50 ML (INFUSION)(AM)(OR)
INTRAVENOUS | 0 refills | Status: DC
Start: 2022-07-15 — End: 2022-07-15
  Administered 2022-07-15: 15:00:00 100 ug/kg/min via INTRAVENOUS

## 2022-07-15 MED ORDER — LIDOCAINE 5 % TP OINT
Freq: Once | TOPICAL | 0 refills | Status: CP
Start: 2022-07-15 — End: ?
  Administered 2022-07-15: 15:00:00 via TOPICAL

## 2022-07-15 MED ORDER — PROPOFOL INJ 10 MG/ML IV VIAL
INTRAVENOUS | 0 refills | Status: DC
Start: 2022-07-15 — End: 2022-07-15

## 2022-07-15 MED ORDER — IOHEXOL 350 MG IODINE/ML IV SOLN
80 mL | Freq: Once | INTRAVENOUS | 0 refills | Status: CP
Start: 2022-07-15 — End: ?
  Administered 2022-07-15: 20:00:00 80 mL via INTRAVENOUS

## 2022-07-15 MED ORDER — PERFLUTREN LIPID MICROSPHERES 1.1 MG/ML IV SUSP
1-10 mL | Freq: Once | INTRAVENOUS | 0 refills | Status: AC | PRN
Start: 2022-07-15 — End: ?

## 2022-07-15 MED ORDER — SODIUM CHLORIDE 0.9 % IV SOLP (OR) 500ML
INTRAVENOUS | 0 refills | Status: DC
Start: 2022-07-15 — End: 2022-07-15

## 2022-07-15 NOTE — Progress Notes
Pre-Operative Assessment for TEE or Cardioversion    Date of Service:  07/15/2022    Alison Fuller is a 82 y.o. y.o. female. With significant CKD, mitral valve regurgitation s/p mitral valve clip implantation, CAD, HTN, HLD, who is referred for TEE Indication: MR with previous robotic MVR and mitraclip.      she has been compliant with her  aspirin 81mg  Missed dose: N/A and no bleeding. she is Positive for: none and Positive for: none.      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 Previous Holter study showed 10-15% PVC burden.    Seasonal allergies     Syncope 04/04/2009        Surgical History:   Surgical History:   Procedure Laterality Date    HX CORONARY STENT PLACEMENT  06/2007    L main and LAD    MITRAL VALVE SURGERY  2013    Robotic asisted valvuloplasty and annuloplasty    PERCUTANEOUS TRANSCATHETER REPAIR MITRAL VALVE N/A 01/06/2018    Performed by Lynne Logan, MD at [facility] CVOR    CHOLECYSTECTOMY      HX TUBAL LIGATION         Social History     Social History     Tobacco Use    Smoking status: Never    Smokeless tobacco: Never   Substance Use Topics    Alcohol use: No    Drug use: No         Allergies                                        Allergies   Allergen Reactions    Doxycycline BLISTERS and EDEMA     Lip Swelling    Lipitor [Atorvastatin] MUSCLE PAIN    Sulfadiazine EDEMA     Sulfa drugs          Current Medications  Current Outpatient Medications on File Prior to Encounter   Medication Sig Dispense Refill    acetaminophen (TYLENOL) 325 mg tablet Take two tablets by mouth every 6 hours as needed.  0    aspirin EC 81 mg PO tablet Take 1 Tab by mouth Daily. (Patient taking differently: Take one tablet by mouth at bedtime daily.) 90 Tab 3    Calcium-Magnesium-Zinc 333-133-5 mg tab Take 1 tablet by mouth daily.      Cholecalciferol (Vitamin D3) 50 mcg (2,000 unit) capsule Take one capsule by mouth daily.      fish oil- omega 3-DHA/EPA 300/1,000 mg capsule Take one capsule by mouth daily.      hydroCHLOROthiazide (HYDRODIURIL) 25 mg tablet Take one tablet by mouth daily. 90 tablet 3    lisinopriL (ZESTRIL) 40 mg tablet Take  one tablet by mouth daily. 90 tablet 3    nitroglycerin (NITROSTAT) 0.4 mg tablet Place one tablet under tongue as Needed for Chest Pain. 25 tablet 2    pantoprazole DR (PROTONIX) 40 mg tablet Take one tablet by mouth daily.      pravastatin (PRAVACHOL) 40 mg tablet Take one tablet by mouth at bedtime daily. 90 tablet 3    vitamins, multiple tablet Take one tablet by mouth daily.         No current facility-administered medications on file prior to encounter.       Vitals  Estimated body mass index is 19.28 kg/m? as calculated from the following:    Height as of 07/10/22: 154.9 cm (5' 0.98).    Weight as of 07/10/22: 46.3 kg (102 lb).       Patient appears alert and oriented: Yes  NPO: for greater than 8 hours  Inpatient IV status:  22g R FA    Diagnostic Tests  White Blood Cells   Date Value Ref Range Status   04/30/2022 7.15  Final     Hemoglobin   Date Value Ref Range Status   04/30/2022 11.5  Final     Hematocrit   Date Value Ref Range Status   04/30/2022 37.1  Final     Platelet Count   Date Value Ref Range Status   04/30/2022 244  Final     Sodium   Date Value Ref Range Status   06/25/2022 144  Final     Potassium   Date Value Ref Range Status   06/25/2022 4.6  Final     Magnesium   Date Value Ref Range Status   07/13/2011 2.1 1.6 - 2.6 MG/DL Final     Blood Urea Nitrogen   Date Value Ref Range Status   06/25/2022 29 (H) 8 - 23 Final     Creatinine   Date Value Ref Range Status   06/25/2022 1.26  Final     Glucose   Date Value Ref Range Status   06/25/2022 93  Final   03/29/2015 94 70 - 100 mg/dL Final       Last MAC INR Flow Sheet Entry:    Last recorded Lab results:   INR   Date Value Ref Range Status   01/06/2018 0.9 0.8 - 1.2 Final   01/05/2018 0.9 0.8 - 1.2 Final   07/12/2011 1.1 0.8 - 1.2 Final   07/11/2011 0.9 0.8 - 1.2 Final     APTT   Date Value Ref Range Status   01/06/2018 41.1 (H) 24.0 - 36.5 SEC Final           Blood Cultures  Resulted Micro Last 72 Hrs    No results found         Last TEE date:  07/10/22  Last Cardioversion date: None  Echo procedures within the past 30 days:  No results found.      Device Information on File  No results found for: GENERATOR, EPDEVTYP      Additional Comments:  None    Plan:  Dr. Christain Sacramento and Dr. Ahmed Prima will plan to proceed with the  TEE.

## 2022-07-15 NOTE — Progress Notes
Care Plan   Care Category & Patient Outcome Goal Met Treatment/  Interventions Plan of the Day RN Name   Cardiovascular  Hemodynamic stability and adequate peripheral perfusion.   Yes   Refer to  patient's chart. Monitor VS per sedation standard. Monitor ECG continuously.  Assess/maintain IV patency.   Stefen Juba, RN   Respiratory  Patent airway, ease of respiration, and adequate oxygenation.   Yes   Refer to  patient's chart. Maintain open airway. Assess respirations. Monitor O2 saturations. Titrate O2 to keep sat>= 95% or baseline.   Haru Anspaugh, RN   Psychological/Emotional/Spiritual  Cope with procedure with support in place.  Spiritual needs are addressed.     Yes   Refer to  patient's chart. Provide adequate and thorough instructions.  Provide a caring and supportive environment.  Communicate patients concerns with other members of the health team.   Kahlia Lagunes, RN   Pain  Patients pain goal met.   Yes   Refer to  patient's chart. Prepare patient for potentially uncomfortable procedure.  Observe for verbal/nonverbal complaints of pain.  Assess pain.  Provide comfort measures.   Vi Whitesel, RN   Safety/Fall Risk  Free from injury, security maintained.   Yes High Risk    Refer to  patient's chart.   Follow nursing standard of practice for high risks fall patients.   Mercie Balsley, RN   Knowledge Base  Verbalize understanding of procedure/information provided.   Yes   Refer to  patient's chart. Describe the procedure along with what symptoms to expect.  Evaluate patients understanding of procedure.  Encourage patient to ask questions.  Provide additional information as needed.   Martine Bleecker, RN

## 2022-07-17 ENCOUNTER — Encounter: Admit: 2022-07-17 | Discharge: 2022-07-17 | Payer: MEDICARE | Primary: Primary Care

## 2022-07-17 NOTE — Progress Notes
Pt's CTA sent to pt's PCP per request of D. Ouida Sills, NP.      Vernita Tague,RN

## 2022-07-17 NOTE — Telephone Encounter
07/17/2022 4:45 PM     Patient returned call, discussed Ginger McIntosh-James, APRN-NP's note with review of chest X-ray. All questions addressed.    -----------------------------------------------------    Alison Fuller, I had not seen the chest x-ray from performed on 06/21/2018 for diagnostic imaging.  The x-ray looks okay, no acute cardiopulmonary abnormality.  There is some calcification present in the thoracic aorta with no aneurysmal dilatation.   Thanks for sending me the result.     Patient to follow-up in the valve clinic with Dr. Sherril Cong, NP on 07/15/2022.  Refer to his office visit note for details.  TEE was performed on 3/18 which was reviewed by both Dr. Lorriane Shire and Dr. Landis Gandy.  Despite MitraClip she has severe MR.  Unfortunately because of elevated gradient putting in a second MitraClip device is not an option." Based on her anatomy we may have an opportunity to perform a SAPIEN TMVR     Patient has scheduled follow-up with TLR on 10/25/2022 and needs to keep that appointment.     Thanks.  GM

## 2022-08-12 ENCOUNTER — Encounter: Admit: 2022-08-12 | Discharge: 2022-08-12 | Payer: MEDICARE | Primary: Primary Care

## 2022-08-12 ENCOUNTER — Ambulatory Visit: Admit: 2022-08-12 | Discharge: 2022-08-12 | Payer: MEDICARE | Primary: Primary Care

## 2022-08-12 DIAGNOSIS — Z9889 Other specified postprocedural states: Secondary | ICD-10-CM

## 2022-08-12 DIAGNOSIS — I2510 Atherosclerotic heart disease of native coronary artery without angina pectoris: Secondary | ICD-10-CM

## 2022-08-12 DIAGNOSIS — I340 Nonrheumatic mitral (valve) insufficiency: Secondary | ICD-10-CM

## 2022-09-09 ENCOUNTER — Encounter: Admit: 2022-09-09 | Discharge: 2022-09-09 | Payer: MEDICARE | Primary: Primary Care

## 2022-09-09 DIAGNOSIS — I340 Nonrheumatic mitral (valve) insufficiency: Secondary | ICD-10-CM

## 2022-09-09 DIAGNOSIS — J302 Other seasonal allergic rhinitis: Secondary | ICD-10-CM

## 2022-09-09 DIAGNOSIS — E785 Hyperlipidemia, unspecified: Secondary | ICD-10-CM

## 2022-09-09 DIAGNOSIS — J40 Bronchitis, not specified as acute or chronic: Secondary | ICD-10-CM

## 2022-09-09 DIAGNOSIS — M1990 Unspecified osteoarthritis, unspecified site: Secondary | ICD-10-CM

## 2022-09-09 DIAGNOSIS — R55 Syncope and collapse: Secondary | ICD-10-CM

## 2022-09-09 DIAGNOSIS — N189 Chronic kidney disease, unspecified: Secondary | ICD-10-CM

## 2022-09-09 DIAGNOSIS — I493 Ventricular premature depolarization: Secondary | ICD-10-CM

## 2022-09-09 DIAGNOSIS — R002 Palpitations: Secondary | ICD-10-CM

## 2022-09-09 DIAGNOSIS — Z8711 Personal history of peptic ulcer disease: Secondary | ICD-10-CM

## 2022-09-09 DIAGNOSIS — I2510 Atherosclerotic heart disease of native coronary artery without angina pectoris: Secondary | ICD-10-CM

## 2022-09-09 DIAGNOSIS — I10 Essential (primary) hypertension: Secondary | ICD-10-CM

## 2022-09-09 NOTE — Patient Instructions
Thank you for visiting our office today.    We would like to make the following medication adjustments:  NONE       Otherwise continue the same medications as you have been doing.          We will be pursuing the following tests after your appointment today:            We will plan to see you back in 3 months.  Please call us in the meantime with any questions or concerns.        Please allow 5-7 business days for our providers to review your results. All normal results will go to MyChart. If you do not have Mychart, it is strongly recommended to get this so you can easily view all your results. If you do not have mychart, we will attempt to call you once with normal lab and testing results. If we cannot reach you by phone with normal results, we will send you a letter.  If you have not heard the results of your testing after one week please give [place] a call.       Your Cardiovascular Medicine [place] Team Brett Canales, Pilar Jarvis, Shawna Orleans, and [place])  phone number is [phone].

## 2022-11-19 ENCOUNTER — Encounter: Admit: 2022-11-19 | Discharge: 2022-11-19 | Payer: MEDICARE | Primary: Primary Care

## 2022-12-19 ENCOUNTER — Encounter: Admit: 2022-12-19 | Discharge: 2022-12-19 | Payer: MEDICARE | Primary: Primary Care

## 2022-12-19 NOTE — Telephone Encounter
Luanna called nursing line with concerns of bill she received for lab work she had done at [facility]  in [place] on 06/25/22 that Ginger, APRN ordered. Patient states billing couldn't help her. Discussed with [facility] and provided additional codes for billing. She states she will have the billing department at [facility] work on this and then will notify patient. I spoke with Marylu Lund on the phone and explained to her additional codes have been provided to see if the [facility] lab will be covered and they will contact her to let her know.

## 2023-01-01 ENCOUNTER — Encounter: Admit: 2023-01-01 | Discharge: 2023-01-01 | Payer: MEDICARE | Primary: Primary Care

## 2023-01-01 NOTE — Telephone Encounter
Called pt lmom gave scheduling number to reschedule a  follow up.

## 2023-03-12 ENCOUNTER — Encounter: Admit: 2023-03-12 | Discharge: 2023-03-12 | Payer: MEDICARE | Primary: Primary Care

## 2023-03-20 ENCOUNTER — Encounter: Admit: 2023-03-20 | Discharge: 2023-03-20 | Payer: MEDICARE | Primary: Primary Care

## 2023-03-20 NOTE — Progress Notes
CTA Chest dated 03/18/224 sent to PCP on file. Report recommends a CT Chest.- This was done on 07/17/2022 by cardiology team.    03/20/2023-Phone call placed to PCP on file.  LVM with office staff regarding recommendations made on CTA Chest dated 07/15/2022. Requested phone call back to verify receipt of records and discuss recommendations.

## 2023-04-26 ENCOUNTER — Encounter: Admit: 2023-04-26 | Discharge: 2023-04-26 | Payer: MEDICARE | Primary: Primary Care

## 2023-05-09 ENCOUNTER — Encounter: Admit: 2023-05-09 | Discharge: 2023-05-09 | Payer: MEDICARE | Primary: Primary Care

## 2023-05-09 MED ORDER — PRAVASTATIN 40 MG PO TAB
40 mg | ORAL_TABLET | Freq: Every evening | ORAL | 3 refills | 90.00000 days | Status: AC
Start: 2023-05-09 — End: ?

## 2023-05-20 ENCOUNTER — Encounter: Admit: 2023-05-20 | Discharge: 2023-05-20 | Payer: MEDICARE | Primary: Primary Care

## 2023-05-20 MED ORDER — HYDROCHLOROTHIAZIDE 25 MG PO TAB
25 mg | ORAL_TABLET | Freq: Every day | ORAL | 3 refills | 28.00000 days | Status: AC
Start: 2023-05-20 — End: ?

## 2023-05-20 MED ORDER — PANTOPRAZOLE 40 MG PO TBEC
40 mg | ORAL_TABLET | Freq: Every day | ORAL | 3 refills | 90.00000 days | Status: AC
Start: 2023-05-20 — End: ?

## 2023-05-20 MED ORDER — PRAVASTATIN 40 MG PO TAB
40 mg | ORAL_TABLET | Freq: Every evening | ORAL | 3 refills | 90.00000 days | Status: AC
Start: 2023-05-20 — End: ?

## 2023-05-20 MED ORDER — LISINOPRIL 40 MG PO TAB
40 mg | ORAL_TABLET | Freq: Every day | ORAL | 3 refills | Status: AC
Start: 2023-05-20 — End: ?

## 2023-06-05 ENCOUNTER — Encounter: Admit: 2023-06-05 | Discharge: 2023-06-05 | Payer: MEDICARE | Primary: Primary Care

## 2023-06-06 ENCOUNTER — Encounter: Admit: 2023-06-06 | Discharge: 2023-06-06 | Payer: MEDICARE | Primary: Primary Care

## 2023-06-06 DIAGNOSIS — I10 Essential (primary) hypertension: Secondary | ICD-10-CM

## 2023-06-06 DIAGNOSIS — R0609 Other forms of dyspnea: Secondary | ICD-10-CM

## 2023-06-06 DIAGNOSIS — E876 Hypokalemia: Secondary | ICD-10-CM

## 2023-06-06 DIAGNOSIS — I493 Ventricular premature depolarization: Secondary | ICD-10-CM

## 2023-06-06 DIAGNOSIS — I2510 Atherosclerotic heart disease of native coronary artery without angina pectoris: Secondary | ICD-10-CM

## 2023-06-06 DIAGNOSIS — E782 Mixed hyperlipidemia: Secondary | ICD-10-CM

## 2023-06-06 DIAGNOSIS — I340 Nonrheumatic mitral (valve) insufficiency: Secondary | ICD-10-CM

## 2023-06-06 DIAGNOSIS — R0602 Shortness of breath: Secondary | ICD-10-CM

## 2023-06-06 MED ORDER — PANTOPRAZOLE 40 MG PO TBEC
40 mg | ORAL_TABLET | Freq: Every day | ORAL | 3 refills | 90.00000 days | Status: AC
Start: 2023-06-06 — End: ?

## 2023-06-06 MED ORDER — HYDROCHLOROTHIAZIDE 25 MG PO TAB
25 mg | ORAL_TABLET | Freq: Every day | ORAL | 3 refills | 28.00000 days | Status: AC
Start: 2023-06-06 — End: ?

## 2023-06-06 MED ORDER — PRAVASTATIN 40 MG PO TAB
40 mg | ORAL_TABLET | Freq: Every evening | ORAL | 3 refills | 90.00000 days | Status: AC
Start: 2023-06-06 — End: ?

## 2023-06-06 MED ORDER — LISINOPRIL 40 MG PO TAB
40 mg | ORAL_TABLET | Freq: Every day | ORAL | 3 refills | Status: AC
Start: 2023-06-06 — End: ?

## 2023-06-06 MED ORDER — NITROGLYCERIN 0.4 MG SL SUBL
0.4 mg | ORAL_TABLET | SUBLINGUAL | 2 refills | 9.00000 days | Status: AC | PRN
Start: 2023-06-06 — End: ?

## 2023-06-16 ENCOUNTER — Encounter: Admit: 2023-06-16 | Discharge: 2023-06-16 | Payer: MEDICARE | Primary: Primary Care

## 2023-06-16 DIAGNOSIS — I340 Nonrheumatic mitral (valve) insufficiency: Secondary | ICD-10-CM

## 2023-06-16 DIAGNOSIS — E876 Hypokalemia: Secondary | ICD-10-CM

## 2023-06-16 DIAGNOSIS — E782 Mixed hyperlipidemia: Secondary | ICD-10-CM

## 2023-06-16 DIAGNOSIS — R0602 Shortness of breath: Secondary | ICD-10-CM

## 2023-06-16 DIAGNOSIS — I493 Ventricular premature depolarization: Secondary | ICD-10-CM

## 2023-06-16 DIAGNOSIS — R0609 Other forms of dyspnea: Secondary | ICD-10-CM

## 2023-06-16 DIAGNOSIS — I2510 Atherosclerotic heart disease of native coronary artery without angina pectoris: Secondary | ICD-10-CM

## 2023-06-16 DIAGNOSIS — I10 Essential (primary) hypertension: Secondary | ICD-10-CM

## 2023-06-16 MED ORDER — HYDROCHLOROTHIAZIDE 25 MG PO TAB
25 mg | ORAL_TABLET | Freq: Every day | ORAL | 3 refills | 28.00000 days | Status: AC
Start: 2023-06-16 — End: ?

## 2023-06-16 MED ORDER — NITROGLYCERIN 0.4 MG SL SUBL
0.4 mg | ORAL_TABLET | SUBLINGUAL | 2 refills | 9.00000 days | Status: AC | PRN
Start: 2023-06-16 — End: ?

## 2023-06-16 MED ORDER — PRAVASTATIN 40 MG PO TAB
40 mg | ORAL_TABLET | Freq: Every evening | ORAL | 3 refills | 90.00000 days | Status: AC
Start: 2023-06-16 — End: ?

## 2023-06-16 MED ORDER — LISINOPRIL 40 MG PO TAB
40 mg | ORAL_TABLET | Freq: Every day | ORAL | 3 refills | Status: AC
Start: 2023-06-16 — End: ?

## 2023-06-20 ENCOUNTER — Encounter: Admit: 2023-06-20 | Discharge: 2023-06-20 | Payer: MEDICARE | Primary: Primary Care

## 2023-06-20 ENCOUNTER — Ambulatory Visit: Admit: 2023-06-20 | Discharge: 2023-06-21 | Payer: MEDICARE | Primary: Primary Care

## 2023-06-20 DIAGNOSIS — I25118 Atherosclerotic heart disease of native coronary artery with other forms of angina pectoris: Secondary | ICD-10-CM

## 2023-06-20 DIAGNOSIS — I340 Nonrheumatic mitral (valve) insufficiency: Secondary | ICD-10-CM

## 2023-06-20 DIAGNOSIS — E782 Mixed hyperlipidemia: Secondary | ICD-10-CM

## 2023-06-20 DIAGNOSIS — I10 Essential (primary) hypertension: Secondary | ICD-10-CM

## 2023-06-20 NOTE — Patient Instructions
 Thank you for visiting our office today.    We would like to make the following medication adjustments:  NONE       Otherwise continue the same medications as you have been doing.          We will be pursuing the following tests after your appointment today:            We will plan to see you back in 12 months.  Please call us in the meantime with any questions or concerns.        Please allow 5-7 business days for our providers to review your results. All normal results will go to MyChart. If you do not have Mychart, it is strongly recommended to get this so you can easily view all your results. If you do not have mychart, we will attempt to call you once with normal lab and testing results. If we cannot reach you by phone with normal results, we will send you a letter.  If you have not heard the results of your testing after one week please give [place] a call.       Your Cardiovascular Medicine [place] Team Brett Canales, Pilar Jarvis, Shawna Orleans, and [place])  phone number is [phone].

## 2023-06-20 NOTE — Progress Notes
 CARDIOLOGY PROGRESS NOTE    Alison Fuller                      Events  Hemodynamic parameters: Normal blood pressure and heart rate noted in our office today.    Subjective:    No significant anginal symptoms reported  Continues to be very active physically  She does have her for 1 day a week, remainder of the days, works as a Conservation officer, nature at [employer], also cares for her daughter who has congestive heart failure and multiple sclerosis    Assessment & Recs     1.  Severe mitral insufficiency  2.  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  Benign essential hypertension  4.  Mixed hyperlipidemia  5.  Coronary artery disease with previous left main PCI with chronic stable angina-patent stent in 7459    83 year old woman with a history of severe mitral insufficiency, surgically treated with a 27 mm annuloplasty posterior band with P2 resection with severe mitral insufficiency diagnosed in 2019.  She was treated with MV TEER with a single MitraClip in the A2/P2 scallops with residual severe MR.  Her mean gradient is 6 to 8 mmHg on follow-up echo.  Last year, she had a TEE which showed severe MR despite the presence of a clip which is attached to both the anterior leaflet and the posterior band.  She had a gradient of 6 mmHg.  She was offered repeat surgery by Dr. Elias Else but albeit at a higher risk of complications.  She decided to opt for conservative management and has been on medical therapy.  Fortunately, she has not had significant heart failure episodes.  She is able to walk 1 mile without significant dyspnea.  As outlined above, she remains physically very active and has not had a heart failure admission.  She is only on hydrochlorothiazide and optimal medical therapy for hypertension.  She remains in [place] Heart Association class I-II symptom profile. No anginal symptoms reported.  Not taking nitroglycerin pills.  No bleeding on aspirin.    Recommendations    1.  Though she does have severe mitral insufficiency despite the presence of an appropriately adherent MitraClip device, she has no symptoms suggestive of progressive heart failure.  She is barely class I-II in terms of symptoms.  She is not on loop diuretics and continues to remain on hydrochlorothiazide.  On today's examination she is clinically euvolemic.  In view of this, my recommendation is to proceed conservatively.  She does exhibit evidence of volume overload, my recommendation would be to consider loop diuretics at this juncture.  She is at high risk for open surgical reintervention with a redo surgery and she is certainly not induced by this idea either.  2.  Home blood pressure log reviewed.  Target less than 140/90 mmHg.  Presently well-controlled.  No change.  3.  Last known LDL level was in the mid 80s.  She has a left main stent which was patent in 2019.  No significant disease elsewhere in the coronary arterial system noted.  She remains angina free.  Not taking any nitroglycerin pills.  Ideally her target LDL is less than 55 mg/dL.  She has tried atorvastatin and Crestor in the past and has had reactions to them and hence would like to stay on  pravastatin.  We also discussed PCSK9 inhibitors which she is not interested.  4.  The absence of angina or clinical heart failure, will defer ischemic stress testing  5.  Conservative management recommended for her mitral insufficiency.  6.  Clinic follow-up in 1 year with Dr. Beckie Salts.      Jonell Cluck, MD, [facility], [facility]  Interventional Cardiologist    Medications  Current medications reviewed    Review Of Systems  None besides facts mentioned above      Physical Exam                          Vital Signs: Most Recent   Vitals:    06/20/23 1342   BP: 131/66   BP Source: Arm, Right Upper   Pulse: 71   SpO2: 96%   O2 Device: None (Room air) PainSc: Zero   Weight: 47.7 kg (105 lb 3.2 oz)   Height: 152.4 cm (5')          General Appearance: moderately overweight, no distress   Neck Veins: normal JVP , neck veins are not distended   Cardiovascular system: Pulse 86/min, regular rhythm , normal volume, no specific character, felt equally in all peripheries and there was no radio femoral delay.  PMI undisplaced, no palpable thrills/heaves, S1 S2 heard, 2/6 PSM over mitral area, no rub, no jugular venous distension, no carotid bruit.  Peripheral Circulation: normal peripheral circulation   Pedal Pulses: normal symmetric pedal pulses   Carotid Arteries: normal carotid upstroke bilaterally, no bruits   Respiratory system: No acute distress  No use of accessory muscles  Normal vesicular breath sounds over all the lung fields bilaterally  No added sounds      Lab/Radiology/Other Diagnostic Tests:    Pertinent labs, cardiac imaging and EKG reviewed.    Total Time Today was 40 minutes in the following activities: Preparing to see the patient, Obtaining and/or reviewing separately obtained history, Performing a medically appropriate examination and/or evaluation, Counseling and educating the patient/family/caregiver, Ordering medications, tests, or procedures, Referring and communication with other health care professionals (when not separately reported), Documenting clinical information in the electronic or other health record, Independently interpreting results (not separately reported) and communicating results to the patient/family/caregiver, and Care coordination (not separately reported)    This note was dictated using the dragon speech recognition software.  Transcription errors may occur with the use of this software. Editing and proofreading were done by the author of this document.  In spite of the author's best effort to identify every error introduced by voice to text dictation, some errors that may represent misspelling or misstatements of what was dictated may persist.  If there are questions about content in this document please contact Jonell Cluck MD

## 2024-04-27 ENCOUNTER — Encounter: Admit: 2024-04-27 | Discharge: 2024-04-27 | Payer: MEDICARE | Primary: Primary Care

## 2024-05-06 ENCOUNTER — Encounter: Admit: 2024-05-06 | Discharge: 2024-05-06 | Payer: MEDICARE | Primary: Primary Care

## 2024-05-06 MED ORDER — LISINOPRIL 40 MG PO TAB
40 mg | ORAL_TABLET | Freq: Every day | ORAL | 1 refills | 90.00000 days | Status: AC
Start: 2024-05-06 — End: ?

## 2024-05-06 MED ORDER — PANTOPRAZOLE 40 MG PO TBEC
40 mg | ORAL_TABLET | Freq: Every day | ORAL | 1 refills | 90.00000 days | Status: AC
Start: 2024-05-06 — End: ?

## 2024-05-06 MED ORDER — HYDROCHLOROTHIAZIDE 25 MG PO TAB
25 mg | ORAL_TABLET | Freq: Every day | ORAL | 1 refills | 28.00000 days | Status: AC
Start: 2024-05-06 — End: ?

## 2024-05-06 MED ORDER — PRAVASTATIN 40 MG PO TAB
40 mg | ORAL_TABLET | Freq: Every evening | ORAL | 1 refills | 90.00000 days | Status: AC
Start: 2024-05-06 — End: ?

## 2024-05-06 NOTE — Telephone Encounter [36]
 05/06/2024 11:49 AM     Pt called in wanting prescriptions sent to [facility].  Pt had phone number to match to pharmacy. Verified all meds needed and doses.  Pt does not wish to have nitro filled at present time.  Pt needs labs soon.  Pt states PCP orders and follows labs and plans to have labs completed before follow up visit with Dr. Charlette in April.

## 2024-05-20 ENCOUNTER — Encounter: Admit: 2024-05-20 | Discharge: 2024-05-20 | Payer: MEDICARE | Primary: Primary Care

## 2024-05-20 MED ORDER — PRAVASTATIN 40 MG PO TAB
40 mg | ORAL_TABLET | Freq: Every evening | ORAL | 0 refills | 90.00000 days | Status: AC
Start: 2024-05-20 — End: ?

## 2024-05-20 MED ORDER — LISINOPRIL 40 MG PO TAB
40 mg | ORAL_TABLET | Freq: Every day | ORAL | 0 refills | 90.00000 days | Status: AC
Start: 2024-05-20 — End: ?

## 2024-05-20 MED ORDER — HYDROCHLOROTHIAZIDE 25 MG PO TAB
25 mg | ORAL_TABLET | Freq: Every day | ORAL | 0 refills | 28.00000 days | Status: AC
Start: 2024-05-20 — End: ?
